# Patient Record
Sex: Female | Born: 1990 | Race: White | Hispanic: No | Marital: Married | Smoking: Never smoker
Health system: Southern US, Community
[De-identification: ages and names within clinical notes are randomized; demographics above are authoritative.]

## PROBLEM LIST (undated history)

## (undated) DIAGNOSIS — J189 Pneumonia, unspecified organism: Secondary | ICD-10-CM

## (undated) DIAGNOSIS — N390 Urinary tract infection, site not specified: Secondary | ICD-10-CM

## (undated) DIAGNOSIS — S42009A Fracture of unspecified part of unspecified clavicle, initial encounter for closed fracture: Secondary | ICD-10-CM

## (undated) DIAGNOSIS — G8929 Other chronic pain: Secondary | ICD-10-CM

## (undated) DIAGNOSIS — R0609 Other forms of dyspnea: Secondary | ICD-10-CM

## (undated) DIAGNOSIS — N809 Endometriosis, unspecified: Secondary | ICD-10-CM

## (undated) DIAGNOSIS — N946 Dysmenorrhea, unspecified: Secondary | ICD-10-CM

## (undated) DIAGNOSIS — E280 Estrogen excess: Secondary | ICD-10-CM

## (undated) DIAGNOSIS — R112 Nausea with vomiting, unspecified: Secondary | ICD-10-CM

## (undated) DIAGNOSIS — R42 Dizziness and giddiness: Secondary | ICD-10-CM

## (undated) DIAGNOSIS — F419 Anxiety disorder, unspecified: Secondary | ICD-10-CM

## (undated) DIAGNOSIS — R55 Syncope and collapse: Secondary | ICD-10-CM

## (undated) DIAGNOSIS — R0600 Dyspnea, unspecified: Secondary | ICD-10-CM

## (undated) DIAGNOSIS — R079 Chest pain, unspecified: Secondary | ICD-10-CM

## (undated) DIAGNOSIS — K5900 Constipation, unspecified: Secondary | ICD-10-CM

## (undated) DIAGNOSIS — R519 Headache, unspecified: Secondary | ICD-10-CM

## (undated) DIAGNOSIS — R002 Palpitations: Secondary | ICD-10-CM

## (undated) DIAGNOSIS — Z9889 Other specified postprocedural states: Secondary | ICD-10-CM

## (undated) HISTORY — DX: Estrogen excess: E28.0

## (undated) HISTORY — DX: Endometriosis, unspecified: N80.9

## (undated) HISTORY — DX: Other chronic pain: G89.29

## (undated) HISTORY — DX: Dyspnea, unspecified: R06.00

## (undated) HISTORY — DX: Other specified postprocedural states: R11.2

## (undated) HISTORY — DX: Dysmenorrhea, unspecified: N94.6

## (undated) HISTORY — DX: Fracture of unspecified part of unspecified clavicle, initial encounter for closed fracture: S42.009A

## (undated) HISTORY — DX: Dizziness and giddiness: R42

## (undated) HISTORY — DX: Urinary tract infection, site not specified: N39.0

## (undated) HISTORY — DX: Headache, unspecified: R51.9

## (undated) HISTORY — PX: ENDOMETRIAL ABLATION: SHX621

## (undated) HISTORY — DX: Palpitations: R00.2

## (undated) HISTORY — DX: Chest pain, unspecified: R07.9

## (undated) HISTORY — DX: Other forms of dyspnea: R06.09

## (undated) HISTORY — DX: Pneumonia, unspecified organism: J18.9

## (undated) HISTORY — DX: Other specified postprocedural states: Z98.890

## (undated) HISTORY — DX: Constipation, unspecified: K59.00

## (undated) HISTORY — DX: Syncope and collapse: R55

## (undated) HISTORY — DX: Anxiety disorder, unspecified: F41.9

---

## 2012-10-25 DIAGNOSIS — N946 Dysmenorrhea, unspecified: Secondary | ICD-10-CM | POA: Insufficient documentation

## 2013-02-01 DIAGNOSIS — N809 Endometriosis, unspecified: Secondary | ICD-10-CM | POA: Insufficient documentation

## 2013-03-15 DIAGNOSIS — K5909 Other constipation: Secondary | ICD-10-CM | POA: Insufficient documentation

## 2016-02-09 DIAGNOSIS — R42 Dizziness and giddiness: Secondary | ICD-10-CM | POA: Insufficient documentation

## 2017-11-15 DIAGNOSIS — F419 Anxiety disorder, unspecified: Secondary | ICD-10-CM | POA: Insufficient documentation

## 2020-02-25 DIAGNOSIS — J343 Hypertrophy of nasal turbinates: Secondary | ICD-10-CM | POA: Insufficient documentation

## 2020-02-25 DIAGNOSIS — M26609 Unspecified temporomandibular joint disorder, unspecified side: Secondary | ICD-10-CM | POA: Insufficient documentation

## 2020-05-20 NOTE — Progress Notes (Deleted)
Cardiology Office Note:    Date:  05/20/2020   ID:  [name], DOB 20-Sep-1990, MRN [id number]  PCP:  No primary care provider on file.  [facility] Cardiologist:  No primary care provider on file.  [facility] Electrophysiologist:  None   Referring MD: Lezlie Lye, PA*    History of Present Illness:    Meagan Richards is a 30 y.o. female with no significant past medical history who was referred by Lazaro Arms, PA for further evaluation of chest pain.   Patient was seen by Cardiology in [facility] in the past for chest pain. Was recommended for exercise echo which was normal. Symptoms were thought to be due to anxiety and possible PTSD. She was recommended for continue lifestyle modification.   No past medical history on file.  *** The histories are not reviewed yet. Please review them in the "History" navigator section and refresh this SmartLink.  Current Medications: No outpatient medications have been marked as taking for the 05/23/20 encounter (Appointment) with Meriam Sprague, MD.     Allergies:   Patient has no allergy information on record.   Social History   Socioeconomic History  . Marital status: Not on file    Spouse name: Not on file  . Number of children: Not on file  . Years of education: Not on file  . Highest education level: Not on file  Occupational History  . Not on file  Tobacco Use  . Smoking status: Not on file  . Smokeless tobacco: Not on file  Substance and Sexual Activity  . Alcohol use: Not on file  . Drug use: Not on file  . Sexual activity: Not on file  Other Topics Concern  . Not on file  Social History Narrative  . Not on file   Social Determinants of Health   Financial Resource Strain: Not on file  Food Insecurity: Not on file  Transportation Needs: Not on file  Physical Activity: Not on file  Stress: Not on file  Social Connections: Not on file     Family History: The patient's ***family history is  not on file.  ROS:   Please see the history of present illness.    *** All other systems reviewed and are negative.  EKGs/Labs/Other Studies Reviewed:    The following studies were reviewed today: Exercise TTE 2019 (OSH): Normal with vigorous LV function  EKG:  EKG is *** ordered today.  The ekg ordered today demonstrates ***  Recent Labs: No results found for requested labs within last 8760 hours.  Recent Lipid Panel No results found for: CHOL, TRIG, HDL, CHOLHDL, VLDL, LDLCALC, LDLDIRECT   Risk Assessment/Calculations:   {Does this patient have ATRIAL FIBRILLATION?:[id number]}   Physical Exam:    VS:  There were no vitals taken for this visit.    Wt Readings from Last 3 Encounters:  No data found for Wt     GEN: *** Well nourished, well developed in no acute distress HEENT: Normal NECK: No JVD; No carotid bruits LYMPHATICS: No lymphadenopathy CARDIAC: ***RRR, no murmurs, rubs, gallops RESPIRATORY:  Clear to auscultation without rales, wheezing or rhonchi  ABDOMEN: Soft, non-tender, non-distended MUSCULOSKELETAL:  No edema; No deformity  SKIN: Warm and dry NEUROLOGIC:  Alert and oriented x 3 PSYCHIATRIC:  Normal affect   ASSESSMENT:    No diagnosis found. PLAN:    In order of problems listed above:  #Chest Pain: #Family history of CAD:   {Are you ordering a CV Procedure (  e.g. stress test, cath, DCCV, TEE, etc)?   Press F2        :[id number]}    Medication Adjustments/Labs and Tests Ordered: Current medicines are reviewed at length with the patient today.  Concerns regarding medicines are outlined above.  No orders of the defined types were placed in this encounter.  No orders of the defined types were placed in this encounter.   There are no Patient Instructions on file for this visit.   Signed, Meriam Sprague, MD  05/20/2020 11:00 AM    [facility]

## 2020-05-23 ENCOUNTER — Ambulatory Visit: Payer: Self-pay | Admitting: Cardiology

## 2020-06-07 NOTE — Progress Notes (Signed)
Cardiology Office Note:    Date:  06/09/2020   ID:  [name], DOB 1990-07-14, MRN [id number]  PCP:  No primary care provider on file.   [facility]  Cardiologist:  No primary care provider on file.  Advanced Practice Provider:  No care team member to display Electrophysiologist:  None    Referring MD: Lezlie Lye, PA*   History of Present Illness:    Meagan Richards is a 30 y.o. female with a hx of anxiety and endometriosis who was referred by Leverne Humbles for further evaluation of chest pain.   The patient states that she has been having intermittent chest pain with occasional jaw, arm, back pain over the past several years. Has been evaluated in the ER before where ECG was normal and labs were normal. Episodes of chest pain are sporadic and she cannot predict when they are going occur. Not exertional related. Episodes can last hours. Occasional swelling in ankles and feet. No orthopnea, PND. Has lightheadedness, dizziness and episodes of syncope especially while on menstrual cycle. Has struggled with multiple health issues over the past several several years and has seen multiple providers which has been frustrating to her. Her father recently passed from a massive MI at age 96 and she states "no one listened to his symptoms and told him he was normal before he passed." She is very concerned that she is having symptoms similar to him.   Family history: Father with MI at age 24 (now deceased), mother with MVP, Maternal GM: MVP, Afib and other leaky valves; Maternal GF with CAD; paternal GF Mi at age 75, maternal GM: MI x2 starting 9s.   Past Medical History:  Diagnosis Date  . Anxiety   . Chest pain   . Chronic headaches   . Clavicle fracture   . Constipation   . Dizziness and giddiness   . DOE (dyspnea on exertion)   . Dysmenorrhea   . Dyspnea   . Endometriosis   . Hyperestrogenism   . Palpitations   . Pneumonia   . PONV  (postoperative nausea and vomiting)   . Syncope and collapse   . UTI (urinary tract infection)   . Vertigo     Past Surgical History:  Procedure Laterality Date  . ENDOMETRIAL ABLATION      Current Medications: Current Meds  Medication Sig  . CLORAZEPATE DIPOTASSIUM PO Take 1 tablet by mouth as needed (for anxiety).  . metoprolol tartrate (LOPRESSOR) 100 MG tablet Take 1 tablet by mouth 2 hours prior to your cardiac ct  . spironolactone (ALDACTONE) 50 MG tablet Take 50 mg by mouth daily.     Allergies:   Latex   Social History   Socioeconomic History  . Marital status: Married    Spouse name: Not on file  . Number of children: Not on file  . Years of education: Not on file  . Highest education level: Not on file  Occupational History  . Not on file  Tobacco Use  . Smoking status: Never Smoker  . Smokeless tobacco: Never Used  Substance and Sexual Activity  . Alcohol use: Never  . Drug use: Never  . Sexual activity: Not on file  Other Topics Concern  . Not on file  Social History Narrative  . Not on file   Social Determinants of Health   Financial Resource Strain: Not on file  Food Insecurity: Not on file  Transportation Needs: Not on file  Physical Activity: Not  on file  Stress: Not on file  Social Connections: Not on file     Family History: Father with MI at age 29 (now deceased), mother with MVP, Maternal GM: MVP, Afib and other leaky valves; Maternal GF with CAD; paternal GF Mi at age 45, maternal GM: MI x2 starting 45s.   ROS:   Please see the history of present illness.    Review of Systems  Constitutional: Positive for malaise/fatigue. Negative for chills and fever.  HENT: Negative for hearing loss.   Eyes: Positive for double vision. Negative for redness.  Respiratory: Positive for shortness of breath.   Cardiovascular: Positive for chest pain, palpitations and leg swelling. Negative for orthopnea, claudication and PND.  Gastrointestinal:  Positive for nausea. Negative for blood in stool and melena.  Genitourinary: Negative for dysuria and flank pain.  Musculoskeletal: Positive for joint pain and myalgias.  Neurological: Positive for dizziness. Negative for loss of consciousness.  Endo/Heme/Allergies: Negative for polydipsia.  Psychiatric/Behavioral: The patient is nervous/anxious.     EKGs/Labs/Other Studies Reviewed:    The following studies were reviewed today: No prior cardiac studies  EKG:  EKG is ordered today.  The ekg ordered today demonstrates NSR with partial RBBB, HR 74  Recent Labs: No results found for requested labs within last 8760 hours.  Recent Lipid Panel No results found for: CHOL, TRIG, HDL, CHOLHDL, VLDL, LDLCALC, LDLDIRECT   Physical Exam:    VS:  BP 96/60   Pulse 74   Ht 5\' 3"  (1.6 m)   Wt 104 lb 3.2 oz (47.3 kg)   SpO2 98%   BMI 18.46 kg/m     Wt Readings from Last 3 Encounters:  06/09/20 104 lb 3.2 oz (47.3 kg)     GEN:  Well nourished, well developed in no acute distress HEENT: Normal NECK: No JVD; No carotid bruits CARDIAC: RRR, no murmurs, rubs, gallops RESPIRATORY:  Clear to auscultation without rales, wheezing or rhonchi  ABDOMEN: Soft, non-tender, non-distended MUSCULOSKELETAL:  No edema; No deformity  SKIN: Warm and dry NEUROLOGIC:  Alert and oriented x 3 PSYCHIATRIC:  Normal affect   ASSESSMENT:    1. Precordial pain   2. Other chest pain    PLAN:    In order of problems listed above:  #Chest Pain: Patient with several year history of chest pain that is episodic in nature. Symptoms are not exertional but have progressed over that time. Has associated jaw, neck, back pain as well as SOB. Has strong family history of premature CAD where her father just passed with a massive MI at age 64. She is very concerned that her symptoms are similar to his and wants to ensure she has a thorough cardiac work-up. -If able, will check coronary CTA -If not, will proceed with  coronary calcium score and exercise treadmill for further work-up    Medication Adjustments/Labs and Tests Ordered: Current medicines are reviewed at length with the patient today.  Concerns regarding medicines are outlined above.  Orders Placed This Encounter  Procedures  . CT CORONARY MORPH W/CTA COR W/SCORE W/CA W/CM &/OR WO/CM  . CT CORONARY FRACTIONAL FLOW RESERVE DATA PREP  . CT CORONARY FRACTIONAL FLOW RESERVE FLUID ANALYSIS  . Basic metabolic panel  . EKG 12-Lead   Meds ordered this encounter  Medications  . metoprolol tartrate (LOPRESSOR) 100 MG tablet    Sig: Take 1 tablet by mouth 2 hours prior to your cardiac ct    Dispense:  1 tablet  Refill:  0    Patient Instructions  Medication Instructions:  Your physician recommends that you continue on your current medications as directed. Please refer to the Current Medication list given to you today.  *If you need a refill on your cardiac medications before your next appointment, please call your pharmacy*   Lab Work: You will need a BMET the week before your cardiac ct.  We will call to let you know when to get that done.   If you have labs (blood work) drawn today and your tests are completely normal, you will receive your results only by: Marland Kitchen MyChart Message (if you have MyChart) OR . A paper copy in the mail If you have any lab test that is abnormal or we need to change your treatment, we will call you to review the results.   Testing/Procedures: Your physician has requested that you have cardiac CT. Cardiac computed tomography (CT) is a painless test that uses an x-ray machine to take clear, detailed pictures of your heart. For further information please visit [email or web address] Please follow instruction sheet BELOW:  Your cardiac CT will be scheduled at one of the below locations:   [facility] [address] [place], [place] [address] [phone]   If scheduled at [facility],  please arrive at the [facility] main entrance (entrance A) of [facility] 30 minutes prior to test start time. Proceed to the [facility] Radiology Department (first floor) to check-in and test prep.   Please follow these instructions carefully (unless otherwise directed):   On the Night Before the Test: . Be sure to Drink plenty of water. . Do not consume any caffeinated/decaffeinated beverages or chocolate 12 hours prior to your test. . Do not take any antihistamines 12 hours prior to your test.  On the Day of the Test: . Drink plenty of water until 1 hour prior to the test. . Do not eat any food 4 hours prior to the test. . You may take your regular medications prior to the test.  . Take metoprolol (Lopressor) 100 mg two hours prior to test. (this has been sent to your pharmacy) . HOLD SPIRONOLACTONE THE NIGHT BEFORE YOUR TEST  . FEMALES- please wear underwire-free bra if available       After the Test: . Drink plenty of water. . After receiving IV contrast, you may experience a mild flushed feeling. This is normal. . On occasion, you may experience a mild rash up to 24 hours after the test. This is not dangerous. If this occurs, you can take Benadryl 25 mg and increase your fluid intake. . If you experience trouble breathing, this can be serious. If it is severe call 911 IMMEDIATELY. If it is mild, please call our office. . If you take any of these medications: Glipizide/Metformin, Avandament, Glucavance, please do not take 48 hours after completing test unless otherwise instructed.   Once we have confirmed authorization from your insurance company, we will call you to set up a date and time for your test. Based on how quickly your insurance processes prior authorizations requests, please allow up to 4 weeks to be contacted for scheduling your Cardiac CT appointment. Be advised that routine Cardiac CT appointments could be scheduled as many as 8 weeks after your provider has  ordered it.  For non-scheduling related questions, please contact the cardiac imaging nurse navigator should you have any questions/concerns: Rockwell Alexandria, Cardiac Imaging Nurse Navigator Larey Brick, Cardiac Imaging Nurse  Navigator [facility] Direct Office Dial: [phone]   For scheduling needs, including cancellations and rescheduling, please call [place], [phone].   Follow-Up: At [facility], you and your health needs are our priority.  As part of our continuing mission to provide you with exceptional heart care, we have created designated Provider Care Teams.  These Care Teams include your primary Cardiologist (physician) and Advanced Practice Providers (APPs -  Physician Assistants and Nurse Practitioners) who all work together to provide you with the care you need, when you need it.  We recommend signing up for the patient portal called "MyChart".  Sign up information is provided on this After Visit Summary.  MyChart is used to connect with patients for Virtual Visits (Telemedicine).  Patients are able to view lab/test results, encounter notes, upcoming appointments, etc.  Non-urgent messages can be sent to your provider as well.   To learn more about what you can do with MyChart, go to [email or web address].    Your next appointment:   Based upon your test   The format for your next appointment:   In Person  Provider:   Laurance FlattenHeather Mireille Lacombe, MD   Other Instructions      Signed, Meriam SpragueHeather E Chirstopher Iovino, MD  06/09/2020 4:11 PM    [facility]

## 2020-06-09 ENCOUNTER — Other Ambulatory Visit: Payer: Self-pay

## 2020-06-09 ENCOUNTER — Ambulatory Visit: Payer: BC Managed Care – PPO | Admitting: Cardiology

## 2020-06-09 ENCOUNTER — Other Ambulatory Visit: Payer: Self-pay | Admitting: *Deleted

## 2020-06-09 ENCOUNTER — Encounter: Payer: Self-pay | Admitting: Cardiology

## 2020-06-09 VITALS — BP 96/60 | HR 74 | Ht 63.0 in | Wt 104.2 lb

## 2020-06-09 DIAGNOSIS — R0789 Other chest pain: Secondary | ICD-10-CM

## 2020-06-09 DIAGNOSIS — R072 Precordial pain: Secondary | ICD-10-CM | POA: Diagnosis not present

## 2020-06-09 DIAGNOSIS — R079 Chest pain, unspecified: Secondary | ICD-10-CM

## 2020-06-09 MED ORDER — METOPROLOL TARTRATE 100 MG PO TABS: ORAL_TABLET | ORAL | 0 refills | Status: DC

## 2020-06-09 NOTE — Patient Instructions (Addendum)
Medication Instructions:  Your physician recommends that you continue on your current medications as directed. Please refer to the Current Medication list given to you today.  *If you need a refill on your cardiac medications before your next appointment, please call your pharmacy*   Lab Work: You will need a BMET the week before your cardiac ct.  We will call to let you know when to get that done.   If you have labs (blood work) drawn today and your tests are completely normal, you will receive your results only by: Marland Kitchen MyChart Message (if you have MyChart) OR . A paper copy in the mail If you have any lab test that is abnormal or we need to change your treatment, we will call you to review the results.   Testing/Procedures: Your physician has requested that you have cardiac CT. Cardiac computed tomography (CT) is a painless test that uses an x-ray machine to take clear, detailed pictures of your heart. For further information please visit [email or web address] Please follow instruction sheet BELOW:  Your cardiac CT will be scheduled at one of the below locations:   [facility] [address] [place], [place] [address] [phone]   If scheduled at [facility], please arrive at the [facility] main entrance (entrance A) of [facility] 30 minutes prior to test start time. Proceed to the [facility] Radiology Department (first floor) to check-in and test prep.   Please follow these instructions carefully (unless otherwise directed):   On the Night Before the Test: . Be sure to Drink plenty of water. . Do not consume any caffeinated/decaffeinated beverages or chocolate 12 hours prior to your test. . Do not take any antihistamines 12 hours prior to your test.  On the Day of the Test: . Drink plenty of water until 1 hour prior to the test. . Do not eat any food 4 hours prior to the test. . You may take your regular medications prior to the test.   . Take metoprolol (Lopressor) 100 mg two hours prior to test. (this has been sent to your pharmacy) . HOLD SPIRONOLACTONE THE NIGHT BEFORE YOUR TEST  . FEMALES- please wear underwire-free bra if available       After the Test: . Drink plenty of water. . After receiving IV contrast, you may experience a mild flushed feeling. This is normal. . On occasion, you may experience a mild rash up to 24 hours after the test. This is not dangerous. If this occurs, you can take Benadryl 25 mg and increase your fluid intake. . If you experience trouble breathing, this can be serious. If it is severe call 911 IMMEDIATELY. If it is mild, please call our office. . If you take any of these medications: Glipizide/Metformin, Avandament, Glucavance, please do not take 48 hours after completing test unless otherwise instructed.   Once we have confirmed authorization from your insurance company, we will call you to set up a date and time for your test. Based on how quickly your insurance processes prior authorizations requests, please allow up to 4 weeks to be contacted for scheduling your Cardiac CT appointment. Be advised that routine Cardiac CT appointments could be scheduled as many as 8 weeks after your provider has ordered it.  For non-scheduling related questions, please contact the cardiac imaging nurse navigator should you have any questions/concerns: Rockwell Alexandria, Cardiac Imaging Nurse Navigator Larey Brick, Cardiac Imaging Nurse Navigator [facility] Direct Office Dial:  [phone]   For scheduling needs, including cancellations and rescheduling, please call [place], [phone].   Follow-Up: At [facility], you and your health needs are our priority.  As part of our continuing mission to provide you with exceptional heart care, we have created designated Provider Care Teams.  These Care Teams include your primary Cardiologist (physician) and Advanced Practice  Providers (APPs -  Physician Assistants and Nurse Practitioners) who all work together to provide you with the care you need, when you need it.  We recommend signing up for the patient portal called "MyChart".  Sign up information is provided on this After Visit Summary.  MyChart is used to connect with patients for Virtual Visits (Telemedicine).  Patients are able to view lab/test results, encounter notes, upcoming appointments, etc.  Non-urgent messages can be sent to your provider as well.   To learn more about what you can do with MyChart, go to [email or web address].    Your next appointment:   Based upon your test   The format for your next appointment:   In Person  Provider:   Laurance Flatten, MD   Other Instructions

## 2020-06-26 ENCOUNTER — Telehealth: Payer: Self-pay | Admitting: Cardiology

## 2020-06-26 NOTE — Telephone Encounter (Signed)
Patients is calling with question concerning her upcoming CT. Please advise

## 2020-06-26 NOTE — Telephone Encounter (Signed)
Reaching out to patient to offer assistance regarding upcoming cardiac imaging study; pt verbalizes understanding of appt date/time, parking situation and where to check in, pre-test NPO status and medications ordered, and verified current allergies; name and call back number provided for further questions should they arise Rockwell Alexandria RN Navigator Cardiac Imaging [facility] [phone] office [phone] cell   Pts recent office visit showed a low BP and has not taken spironolactone since. I presume her BP has improved since then. I have asked the patient to check the BP prior to taking prescribed metoprolol and NOT to take med if SBP < 100  All patients questions were answered. Huntley Dec

## 2020-06-27 ENCOUNTER — Encounter: Payer: Self-pay | Admitting: Cardiology

## 2020-06-27 ENCOUNTER — Telehealth: Payer: Self-pay | Admitting: Cardiology

## 2020-06-27 NOTE — Telephone Encounter (Signed)
Left message for patient to call back  

## 2020-06-27 NOTE — Telephone Encounter (Signed)
Pt c/o BP issue: STAT if pt c/o blurred vision, one-sided weakness or slurred speech  1. What are your last 5 BP readings? Last night 98/70   2. Are you having any other symptoms (ex. Dizziness, headache, blurred vision, passed out)? Last night patient dizzy headache  3. What is your BP issue? Patient has a CT on Monday and need to speak with nurse ASAP

## 2020-06-27 NOTE — Telephone Encounter (Signed)
Meagan Richards 1 hour ago (3:05 PM)   PP    Patient says that she has been going through a lot of health problems and would like to talk with Dr. Shari Prows or nurse regarding them. Please call to discuss       Left message to call back.

## 2020-06-27 NOTE — Telephone Encounter (Signed)
Patient says that she has been going through a lot of health problems and would like to talk with Dr. Shari Prows or nurse regarding them. Please call to discuss

## 2020-06-27 NOTE — Telephone Encounter (Signed)
This encounter was created in error - please disregard.

## 2020-06-30 ENCOUNTER — Ambulatory Visit (HOSPITAL_COMMUNITY): Payer: BC Managed Care – PPO

## 2020-06-30 ENCOUNTER — Telehealth: Payer: Self-pay | Admitting: Cardiology

## 2020-06-30 NOTE — Telephone Encounter (Signed)
Patient called and stated that she cancelled her Cardiac CT today, d/t not hearing back from the nurse, even when several messages were left on the voicemail of the phone number listed. Pt stated her concern was her blood pressure has been low, and was told she wouldn't be able to have CT if blood pressure was low, and had also gone to here gastroenterologist and was told she had a bleeding disorder, and was concerned about doing the CT with this issue as well.   RN stated that she should keep her appointment with Dr. Shari Prows on 07/08/2020 at 8 am, and could discuss her concerns about her CT with her and any other concerns at this time. Pt stated she agreed with this plan and would come to the appointment as scheduled.  Darl Pikes RN

## 2020-06-30 NOTE — Telephone Encounter (Signed)
Pt cancelled cardiac CT.  Has upcoming appt scheduled with Dr. Shari Prows to discuss.  No action needed at this time.

## 2020-06-30 NOTE — Telephone Encounter (Signed)
The patient called and says that she really needs to talk with Dr. Shari Prows or nurse. Says she called last week and did not get a call back and she is really frustrated right now

## 2020-07-01 ENCOUNTER — Telehealth: Payer: Self-pay | Admitting: Physician Assistant

## 2020-07-01 NOTE — Telephone Encounter (Signed)
Patient is a 30 year old female who was recently seen for atypical chest pain and had a coronary CT ordered.  Since then, she has been seen by Dr. Loralyn Freshwater who ordered some test I did told her that she has strep in her body and that she need to evaluate for either scarlet fever or rheumatic fever.  She wanted to obtain additional work-up, I spoke with DOD, since Dr. Livingston Diones started the initial evaluation and requested additional work-up, it would be better if he obtained it in his own office.  She says Dr. Livingston Diones is currently out of town and his PA is undergoing surgery himself therefore they cannot order the lab work.  She also says she has talked to at least 3 providers including her primary care provider, a hospitalist(?), and a dermatologist, all of whom says she should contact cardiology service.  Coronary CT is canceled as she want to work-up her infectious source first as she think her chest pain is coming from the recent strep infection.  I informed her, scarlet fever is usually not directly related to cardiac issues.  Her only symptom is rash on her face at this point.  She is adamant that she want to speak with Dr. Shari Prows or get into see Dr. Shari Prows within the week.  Dr. Devin Going first openings in May.  She does not wish to wait that long.  Again, I informed the patient that her symptom and additional work-up would be better served to be done by a primary care provider or her allergist Dr. Sherre Poot office.  She insisted cardiology service to do the additional work-up and says we are the only one who can.

## 2020-07-02 ENCOUNTER — Other Ambulatory Visit: Payer: Self-pay | Admitting: Pharmacist

## 2020-07-02 ENCOUNTER — Telehealth: Payer: Self-pay | Admitting: *Deleted

## 2020-07-02 MED ORDER — IVABRADINE HCL 5 MG PO TABS: ORAL_TABLET | ORAL | 0 refills | Status: DC

## 2020-07-02 NOTE — Progress Notes (Signed)
7.5mg  of Corlanor given to [facility] in triage for pt.

## 2020-07-02 NOTE — Telephone Encounter (Signed)
-----   Message from Awilda Metro, RPH-CPP sent at 07/02/2020  1:00 PM EDT ----- Regarding: RE: Coronary CTA Corlanor 7.5mg  given to [facility] in triage, sample documented in orders only encounter, thanks! ----- Message ----- From: Meriam Sprague, MD Sent: 07/02/2020  12:28 PM EDT To: Emeline Darling Supple, RPH-CPP, [facility] Triage Subject: Coronary CTA                                   Patient had to cancel her CTA as her blood pressure was in the 90-100s and she was told to not take the metoprolol. Currently her blood pressure remains low in the low 100s. Can we change her to ivabradine for heart rate lowering instead of metoprolol and get her re-scheduled. She is very anxious to have this done. I told her we would call to update her today as well.  Thank you so much!  -Herbert Seta

## 2020-07-02 NOTE — Telephone Encounter (Signed)
Off note:  Will also draw a BMET on this pt while she is in the office on 3/29 to see Dr. Shari Prows, per CTA protocol.  BMET order already previously placed.  Lab appt made for 3/29.

## 2020-07-02 NOTE — Telephone Encounter (Signed)
Called and spoke to the patient. She has recently been told be her Endocrinologist that she has systemic strep. She has been placed on a 6 month course of doxycycline. She was inquiring about a nasal swab and was directed to Cardiology. Given concern for a possible systemic infection and a prolonged ABX course, recommend that she see infectious disease to confirm diagnosis and take over management. We can certainly re-order her CTA and watch for long-term sequela of strep if present, but infectious disease should guide infectious work-up and management. Patient was very grateful for the call.  Laurance Flatten, MD

## 2020-07-02 NOTE — Telephone Encounter (Signed)
Spoke with the pt and informed her that I will leave her samples of Corlanor 7.5 mg to take by mouth for one dose, 2 hours prior to her Cardiac CT that needs to be rescheduled.  Advised her to disregard previous metoprolol one time dose to take prior to her CT, for the metoprolol would make her pressure too soft for preparation of CTA, and Corlanor will not affect this, but will assist with lowering her heart rate for the test.  Informed the pt that the samples will be available for her to pick up at the office at her earliest convenience.  Pt states she lives an hour away, and is already scheduled to come into the office to see Dr. Shari Prows next week on 3/29, and will pick her samples up at that visit.  Advised the pt and reiterated to her that she will need to take the Corlanor 1 1/2 tablets (7.5 mg total) po 2 hours prior to her Coronary CT.  Informed the pt that I will send a message to our CT Nurse Navigator Rockwell [place], and our CT scheduler [place], to call her back and reschedule this test, for a convenient time for her to commute back here.  Pt verbalized understanding and agrees with this plan. Note placed in appt notes that pt will pick up samples at her OV appt with Dr. Shari Prows on 3/29.  Pt appreciative for all the assistance provided.

## 2020-07-06 NOTE — Progress Notes (Signed)
Cardiology Office Note:    Date:  07/08/2020   ID:  Meagan Richards, DOB 26-Jun-1990, MRN [id number]  PCP:  Meagan Richards   [facility]  Cardiologist:  Meriam Sprague, MD  Advanced Practice Provider:  No care team member to display Electrophysiologist:  None    Referring MD: No ref. provider found     History of Present Illness:    Meagan Richards is a 30 y.o. female with a hx of  anxiety and endometriosis who returns to clinic for chest pain.  Last seen in clinic on 05/2820 where she was having intermittent chest pain, jaw and arm pain over the past several years. Had several ER visits where work-up was reassuringly normal. Has also struggled with multiple health issues over the past several several years and has seen multiple providers which has been frustrating to her. Her father r passed from a massive MI at age 49 and she states "no one listened to his symptoms and told him he was normal before he passed." She was very concerned that she is having symptoms similar to him.   At that visit, we recommended coronary CTA which was cancelled due to soft blood pressures. We then recommended using ivabradine instead. She now returns to clinic for follow-up.  Of note, she has also called clinic due to concern for systemic strep infection that was diagnosed by her Immunologist Richards report. She was placed on a 62month course of doxycycline. She had inquired about further testing. We recommended she establish care with Infectious Disease for further work-up and to ensure she merits long-term antibiotic therapy.  Past Medical History:  Diagnosis Date  . Anxiety   . Chest pain   . Chronic headaches   . Clavicle fracture   . Constipation   . Dizziness and giddiness   . DOE (dyspnea on exertion)   . Dysmenorrhea   . Dyspnea   . Endometriosis   . Hyperestrogenism   . Palpitations   . Pneumonia   . PONV (postoperative nausea and vomiting)   . Syncope  and collapse   . UTI (urinary tract infection)   . Vertigo     Past Surgical History:  Procedure Laterality Date  . ENDOMETRIAL ABLATION      Current Medications: Current Meds  Medication Sig  . CLORAZEPATE DIPOTASSIUM PO Take 1 tablet by mouth as needed (for anxiety).     Allergies:   Latex   Social History   Socioeconomic History  . Marital status: Married    Spouse name: Not on file  . Number of children: Not on file  . Years of education: Not on file  . Highest education level: Not on file  Occupational History  . Not on file  Tobacco Use  . Smoking status: Never Smoker  . Smokeless tobacco: Never Used  Substance and Sexual Activity  . Alcohol use: Never  . Drug use: Never  . Sexual activity: Not on file  Other Topics Concern  . Not on file  Social History Narrative  . Not on file   Social Determinants of Health   Financial Resource Strain: Not on file  Food Insecurity: Not on file  Transportation Needs: Not on file  Physical Activity: Not on file  Stress: Not on file  Social Connections: Not on file     Family History: Father with massive MI in his 77s.  ROS:   Please see the history of present illness.    Review  of Systems  Constitutional: Positive for fever and malaise/fatigue. Negative for weight loss.  HENT: Negative for hearing loss.   Eyes: Negative for blurred vision and redness.  Respiratory: Positive for shortness of breath.   Cardiovascular: Positive for chest pain and palpitations. Negative for orthopnea, claudication, leg swelling and PND.  Gastrointestinal: Positive for abdominal pain. Negative for blood in stool and melena.  Genitourinary: Negative for dysuria and flank pain.  Musculoskeletal: Positive for myalgias. Negative for falls.  Skin: Positive for rash.  Neurological: Negative for dizziness and loss of consciousness.  Endo/Heme/Allergies: Negative for polydipsia.  Psychiatric/Behavioral: Negative for substance abuse. The  patient is nervous/anxious.     EKGs/Labs/Other Studies Reviewed:    The following studies were reviewed today: CTA pending   Recent Labs: No results found for requested labs within last 8760 hours.  Recent Lipid Panel No results found for: CHOL, TRIG, HDL, CHOLHDL, VLDL, LDLCALC, LDLDIRECT   Risk Assessment/Calculations:       Physical Exam:    VS:  BP 110/84   Pulse 79   Ht 5\' 3"  (1.6 m)   Wt 101 lb 12.8 oz (46.2 kg)   SpO2 99%   BMI 18.03 kg/m     Wt Readings from Last 3 Encounters:  07/08/20 101 lb 12.8 oz (46.2 kg)  06/09/20 104 lb 3.2 oz (47.3 kg)     GEN:  Well nourished, well developed in no acute distress HEENT: Normal NECK: No JVD; No carotid bruits CARDIAC: RRR, no murmurs, rubs, gallops RESPIRATORY:  Clear to auscultation without rales, wheezing or rhonchi  ABDOMEN: Soft, non-tender, non-distended MUSCULOSKELETAL:  No edema; No deformity  SKIN: Warm and dry NEUROLOGIC:  Alert and oriented x 3 PSYCHIATRIC:  Normal affect   ASSESSMENT:    1. Chest pain, unspecified type   2. Streptococcal infection    PLAN:    In order of problems listed above:  #Chest Pain: Patient with several year history of chest pain that is episodic in nature. Symptoms are not exertional but have progressed over that time. Has associated jaw, neck, back pain as well as SOB. Has strong family history of premature CAD where her father just passed with a massive MI at age 50. She is very concerned that her symptoms are similar to his and wants to ensure she has a thorough cardiac work-up. -Check coronary CTA; plan for ivabradine to slow HR due to soft blood pressure -Check BMET today  #Concern for systemic strep infection: Diagnosed by patient's Immunologist. Was placed on 6 month course doxycycline. Recommended following up with ID for further management. -Follow-up with Infectious Disease       Medication Adjustments/Labs and Tests Ordered: Current medicines are  reviewed at length with the patient today.  Concerns regarding medicines are outlined above.  No orders of the defined types were placed in this encounter.  No orders of the defined types were placed in this encounter.   Patient Instructions  Medication Instructions:  Your physician recommends that you continue on your current medications as directed. Please refer to the Current Medication list given to you today.  *If you need a refill on your cardiac medications before your next appointment, please call your pharmacy*  Lab Work: TODAY: BMET If you have labs (blood work) drawn today and your tests are completely normal, you will receive your results only by: 57 MyChart Message (if you have MyChart) OR . A paper copy in the mail If you have any lab test that is abnormal  or we need to change your treatment, we will call you to review the results.   Follow-Up: At [facility], you and your health needs are our priority.  As part of our continuing mission to provide you with exceptional heart care, we have created designated Provider Care Teams.  These Care Teams include your primary Cardiologist (physician) and Advanced Practice Providers (APPs -  Physician Assistants and Nurse Practitioners) who all work together to provide you with the care you need, when you need it.  Follow up with Dr. Shari Prows as needed based on results of testing.      Signed, Meriam Sprague, MD  07/08/2020 9:17 AM    [facility]

## 2020-07-08 ENCOUNTER — Other Ambulatory Visit: Payer: Self-pay

## 2020-07-08 ENCOUNTER — Encounter: Payer: Self-pay | Admitting: Cardiology

## 2020-07-08 ENCOUNTER — Other Ambulatory Visit: Payer: BC Managed Care – PPO

## 2020-07-08 ENCOUNTER — Ambulatory Visit: Payer: BC Managed Care – PPO | Admitting: Cardiology

## 2020-07-08 VITALS — BP 110/84 | HR 79 | Ht 63.0 in | Wt 101.8 lb

## 2020-07-08 DIAGNOSIS — R079 Chest pain, unspecified: Secondary | ICD-10-CM

## 2020-07-08 DIAGNOSIS — A491 Streptococcal infection, unspecified site: Secondary | ICD-10-CM

## 2020-07-08 LAB — BASIC METABOLIC PANEL
BUN/Creatinine Ratio: 14 (ref 9–23)
BUN: 8 mg/dL (ref 6–20)
CO2: 25 mmol/L (ref 20–29)
Calcium: 9.9 mg/dL (ref 8.7–10.2)
Chloride: 105 mmol/L (ref 96–106)
Creatinine, Ser: 0.58 mg/dL (ref 0.57–1.00)
Glucose: 86 mg/dL (ref 65–99)
Potassium: 4.4 mmol/L (ref 3.5–5.2)
Sodium: 137 mmol/L (ref 134–144)
eGFR: 124 mL/min/{1.73_m2} (ref >59)

## 2020-07-08 NOTE — Telephone Encounter (Signed)
Pt saw Dr. Shari Prows in clinic today, where further plan was discussed.

## 2020-07-08 NOTE — Telephone Encounter (Signed)
Pt saw Dr. Shari Prows in clinic today where further plan was discussed.

## 2020-07-08 NOTE — Patient Instructions (Signed)
Medication Instructions:  Your physician recommends that you continue on your current medications as directed. Please refer to the Current Medication list given to you today.  *If you need a refill on your cardiac medications before your next appointment, please call your pharmacy*  Lab Work: TODAY: BMET If you have labs (blood work) drawn today and your tests are completely normal, you will receive your results only by: Marland Kitchen MyChart Message (if you have MyChart) OR . A paper copy in the mail If you have any lab test that is abnormal or we need to change your treatment, we will call you to review the results.   Follow-Up: At [facility], you and your health needs are our priority.  As part of our continuing mission to provide you with exceptional heart care, we have created designated Provider Care Teams.  These Care Teams include your primary Cardiologist (physician) and Advanced Practice Providers (APPs -  Physician Assistants and Nurse Practitioners) who all work together to provide you with the care you need, when you need it.  Follow up with Dr. Shari Prows as needed based on results of testing.

## 2020-07-10 ENCOUNTER — Telehealth: Payer: Self-pay | Admitting: Cardiology

## 2020-07-10 NOTE — Telephone Encounter (Signed)
RN spoke to [facility] regarding what they needed for the referral. Office notes and labs faxed via epic to Dr. Genice Rouge at [phone]. Notified patient that referral information has been faxed.

## 2020-07-10 NOTE — Telephone Encounter (Signed)
Pt was asked to see a Infection Control Provider, pt needs referral before being seen:  [facility] [place]/Infection [place], [place] [phone]

## 2020-07-11 ENCOUNTER — Telehealth: Payer: Self-pay | Admitting: *Deleted

## 2020-07-11 NOTE — Telephone Encounter (Signed)
-----   Message from Lorrin Jackson sent at 07/11/2020 12:07 PM EDT ----- Regarding: ct heart Scheduled 07/24/20 at 12:15   Thanks, [place]

## 2020-07-15 NOTE — Telephone Encounter (Signed)
[facility] sent a fax back to Dr. Shari Prows today about referral they received on the pt by our office.   Per Provider Dr. Truitt Leep with [facility], she declined referral to them,  stating " This pt is being managed by Immunologist and saw PCP for this as well, and all questions should be advised and directed by the pts Immunologist."  Stamped fax with Dr. Devin Going stamp, so this can be scanned into the pts chart for documentation.   Will also send this message to Dr. Shari Prows as a general FYI, to make her aware of denied referral by Infectious Disease.

## 2020-07-16 NOTE — Telephone Encounter (Signed)
I am worried about the patient being diagnosed with systemic strep and placed on 6 months of antibiotics. I think she would benefit from ID input. Is it possible to refer her to someone else?

## 2020-07-22 ENCOUNTER — Telehealth: Payer: Self-pay | Admitting: Cardiology

## 2020-07-22 NOTE — Telephone Encounter (Signed)
New Message:     Pt says she is scheduled for CT on 07-24-20. She said she was waiting to hear from a nurse to get the instructions.

## 2020-07-22 NOTE — Telephone Encounter (Signed)
Will route this message to our CT Nurse Navigator to follow-up with the pt on Cardiac CT instructions.  CTA is a reschedule from 3/21. Pt saw Dr. Shari Prows in clinic on 3/29 and discussed rescheduling this test at that visit, and pt was given Corlanor samples at that time to take to slow her HR down during the test, due to soft BP.  Will have nurse navigator follow-up with the pt to reinforce instructions.   Samples picked up at last office visit:   Corlanor 7.5 mg to take by mouth for one dose, 2 hours prior to her Cardiac CT     Your cardiac CT will be scheduled at one of the below locations:   [facility] [address] [place], [place] [address] [phone]   If scheduled at [facility], please arrive at the [facility] main entrance (entrance A) of [facility] 30 minutes prior to test start time. Proceed to the [facility] Rad[facility] Department (first floor) to check-in and test prep.   Please follow these instructions carefully (unless otherwise directed):   On the Night Before the Test:  Be sure to Drink plenty of water.  Do not consume any caffeinated/decaffeinated beverages or chocolate 12 hours prior to your test.  Do not take any antihistamines 12 hours prior to your test.  On the Day of the Test:  Drink plenty of water until 1 hour prior to the test.  Do not eat any food 4 hours prior to the test.  You may take your regular medications prior to the test.   Take Corlanor 7.5 mg  by mouth for one dose, 2 hours prior to your Cardiac CT (samples given at last office visit)  FEMALES- please wear underwire-free bra if available       After the Test:  Drink plenty of water.  After receiving IV contrast, you may experience a mild flushed feeling. This is normal.  On occasion, you may experience a mild rash up to 24 hours after the test. This is not dangerous. If this occurs, you can take Benadryl 25 mg and increase your  fluid intake.  If you experience trouble breathing, this can be serious. If it is severe call 911 IMMEDIATELY. If it is mild, please call our office.  If you take any of these medications: Glipizide/Metformin, Avandament, Glucavance, please do not take 48 hours after completing test unless otherwise instructed.   Once we have confirmed authorization from your insurance company, we will call you to set up a date and time for your test. Based on how quickly your insurance processes prior authorizations requests, please allow up to 4 weeks to be contacted for scheduling your Cardiac CT appointment. Be advised that routine Cardiac CT appointments could be scheduled as many as 8 weeks after your provider has ordered it.  For non-scheduling related questions, please contact the cardiac imaging nurse navigator should you have any questions/concerns: Rockwell Alexandria, Cardiac Imaging Nurse Navigator Larey Brick, Cardiac Imaging Nurse Navigator [facility] Direct Office Dial: [phone]   For scheduling needs, including cancellations and rescheduling, please call [place], [phone].

## 2020-07-23 ENCOUNTER — Telehealth (HOSPITAL_COMMUNITY): Payer: Self-pay | Admitting: *Deleted

## 2020-07-23 NOTE — Telephone Encounter (Signed)
Reaching out to patient to offer assistance regarding upcoming cardiac imaging study; pt verbalizes understanding of appt date/time, parking situation and where to check in, pre-test NPO status and medications ordered, and verified current allergies; name and call back number provided for further questions should they arise  Larey Brick RN Navigator Cardiac Imaging [facility] [phone] office [phone] cell  Pt to take 7.5mg  ivabradine 2 hours prior to scan.

## 2020-07-23 NOTE — Telephone Encounter (Signed)
Conversation (Newest Message First)  Dorette Grate, RN     07/23/20 10:00 AM Note Reaching out to patient to offer assistance regarding upcoming cardiac imaging study; pt verbalizes understanding of appt date/time, parking situation and where to check in, pre-test NPO status and medications ordered, and verified current allergies; name and call back number provided for further questions should they arise  Larey Brick RN Navigator Cardiac Imaging [facility] [phone] office [phone] cell  Pt to take 7.5mg  ivabradine 2 hours prior to scan.

## 2020-07-24 ENCOUNTER — Encounter (HOSPITAL_COMMUNITY): Payer: Self-pay

## 2020-07-24 ENCOUNTER — Ambulatory Visit (HOSPITAL_COMMUNITY)
Admission: RE | Admit: 2020-07-24 | Discharge: 2020-07-24 | Disposition: A | Discharge status: Home / Self Care | Payer: BC Managed Care – PPO | Source: Ambulatory Visit | Attending: Cardiology | Admitting: Cardiology

## 2020-07-24 ENCOUNTER — Other Ambulatory Visit: Payer: Self-pay

## 2020-07-24 DIAGNOSIS — R072 Precordial pain: Secondary | ICD-10-CM | POA: Diagnosis present

## 2020-07-24 DIAGNOSIS — Z006 Encounter for examination for normal comparison and control in clinical research program: Secondary | ICD-10-CM

## 2020-07-24 MED ORDER — NITROGLYCERIN 0.4 MG SL SUBL
0.8000 mg | SUBLINGUAL_TABLET | Freq: Once | SUBLINGUAL | Status: AC
  Administered 2020-07-24: 0.8 mg via SUBLINGUAL

## 2020-07-24 MED ORDER — NITROGLYCERIN 0.4 MG SL SUBL
SUBLINGUAL_TABLET | SUBLINGUAL | Status: AC
  Filled 2020-07-24: qty 2

## 2020-07-24 MED ORDER — IOHEXOL 350 MG/ML SOLN
95.0000 mL | Freq: Once | INTRAVENOUS | Status: AC | PRN
  Administered 2020-07-24: 95 mL via INTRAVENOUS

## 2020-07-24 NOTE — Research (Signed)
IDENTIFY Informed Consent                  Subject Name: Meagan Richards   Subject met inclusion and exclusion criteria.  The informed consent form, study requirements and expectations were reviewed with the subject and questions and concerns were addressed prior to the signing of the consent form.  The subject verbalized understanding of the trial requirements.  The subject agreed to participate in the IDENTIFY trial and signed the informed consent at 11:12AM on 07/24/20.  The informed consent was obtained prior to performance of any protocol-specific procedures for the subject.  A copy of the signed informed consent was given to the subject and a copy was placed in the subject's medical record.   Meade Maw , Naval architect

## 2020-07-24 NOTE — Progress Notes (Signed)
CT scan completed. Tolerated well. Pt states her head feels funny. Offered soda for caffeine, pt states she does not drink soda and will drink some tea in the car. Pt states she is ok to go home. D/C home with husband and mother in wheelchair. Awake and alert. In no distress

## 2021-10-07 IMAGING — CT CT HEART MORP W/ CTA COR W/ SCORE W/ CA W/CM &/OR W/O CM
4 of 7 series · 8 of 20 positions shown, 9 images · IV contrast (APPLIED)
Comparison: None.
COMPARISON: None.

Addendum:
EXAM:
OVER-READ INTERPRETATION  CT CHEST

The following report is an over-read performed by radiologist Dr.
Jose Ricardo Rengifo [REDACTED] on 07/24/2020. This
over-read does not include interpretation of cardiac or coronary
anatomy or pathology. The coronary CTA interpretation by the
cardiologist is attached.
CLINICAL DATA: This is a 29 year old female with chest pain/anginal
equivalent.
Cardiac/Coronary  CTA
TECHNIQUE: The patient was scanned on a [employer] Force scanner.

[Series 6: best diast 76 % · axial · 0.32mm/px · z∈[+1060,+1105]mm · 2 of 338 slices shown]
[im 113/338  vessel]
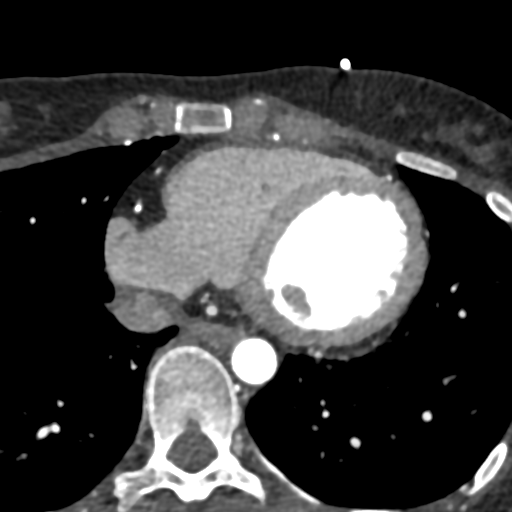
[im 225/338  vessel]
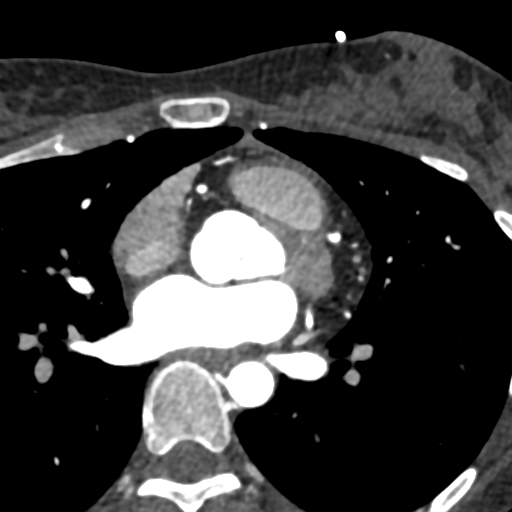

[Series 7: best syst · axial · 0.32mm/px · z∈[+1060,+1105]mm · 2 of 338 slices shown, 3 images]
[im 113/338  vessel]
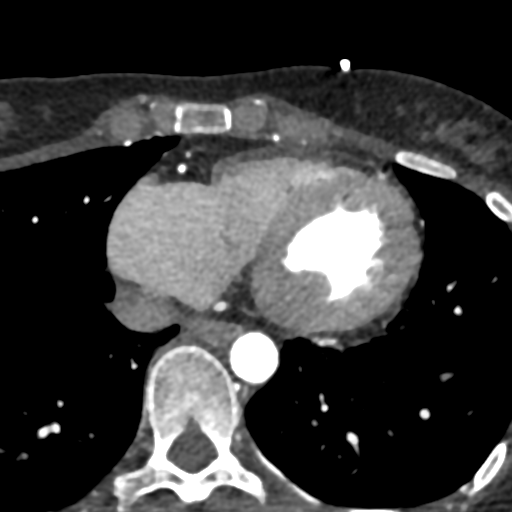
[im 113/338  lung]
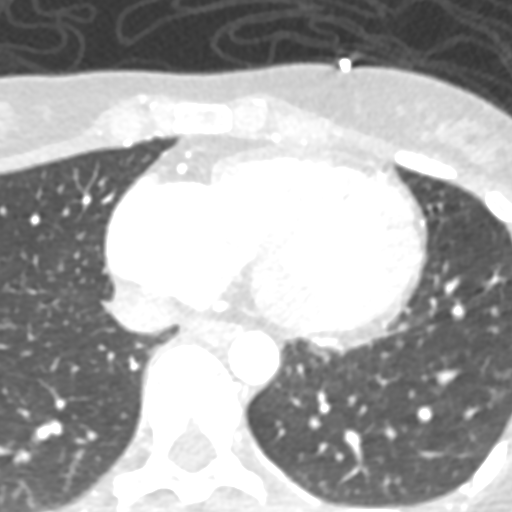
[im 225/338  vessel]
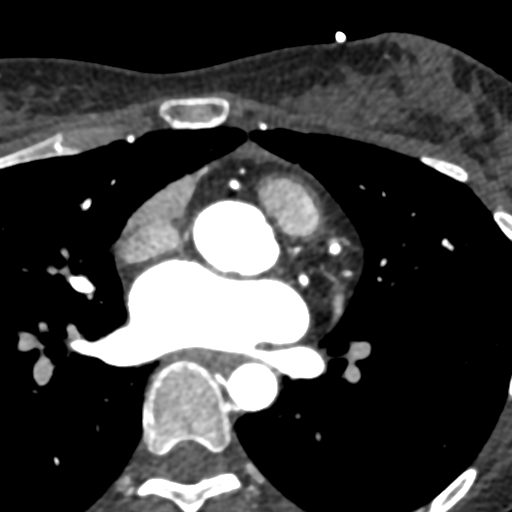

[Series 8: ts diast sharp 76 % · axial · 0.32mm/px · z∈[+1060,+1105]mm · 2 of 338 slices shown]
[im 113/338  lung]
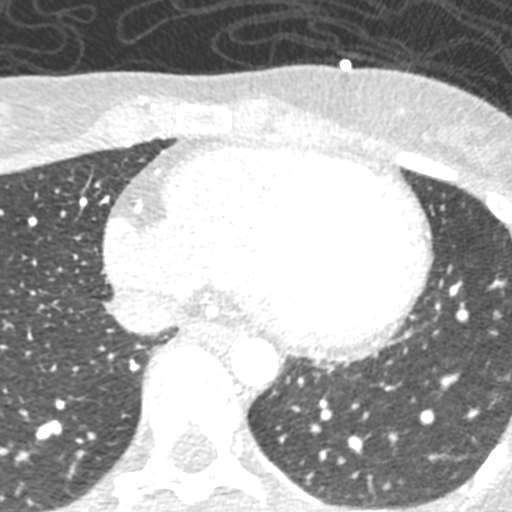
[im 225/338  lung]
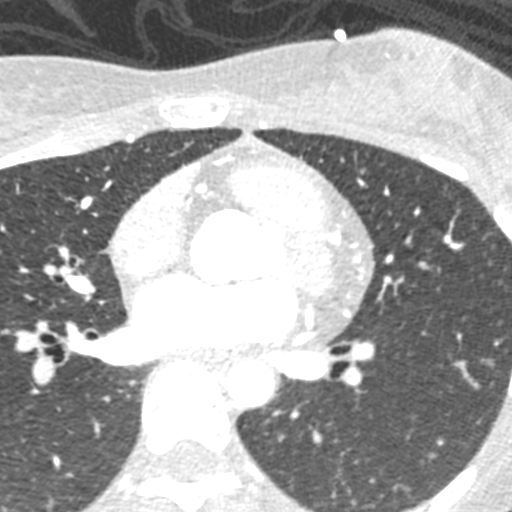

[Series 9: ts syst sharp · axial · 0.32mm/px · z∈[+1060,+1105]mm · 2 of 338 slices shown]
[im 113/338  lung]
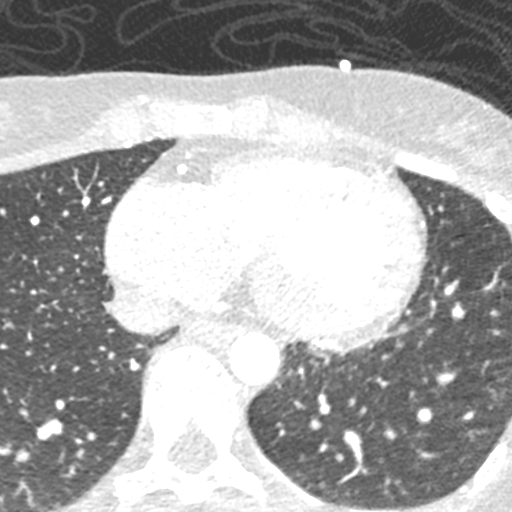
[im 225/338  lung]
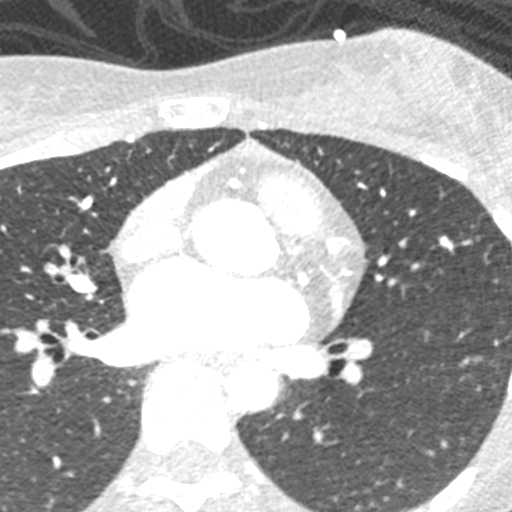

[8 of 20 positions shown; findings below may reference images not displayed]

FINDINGS: Vascular: No significant noncardiac vascular findings.

Mediastinum/Nodes: Visualized mediastinum and hilar regions
demonstrate no lymphadenopathy or masses.

Lungs/Pleura: Visualized lungs show no evidence of pulmonary edema,
consolidation, pneumothorax, nodule or pleural fluid.

Upper Abdomen: No acute abnormality.

Musculoskeletal: No chest wall mass or suspicious bone lesions
identified.
IMPRESSION: No significant incidental findings.
FINDINGS: A 100 kV prospective scan was triggered in the descending thoracic
aorta at 111 HU's. Axial non-contrast 3 mm slices were carried out
through the heart. The data set was analyzed on a dedicated work
station and scored using the Agatson method. Gantry rotation speed
was 250 msecs and collimation was .6 mm. No beta blockade and 0.8 mg
of sl NTG was given. The 3D data set was reconstructed in 5%
intervals of the 67-82 % of the R-R cycle. Diastolic phases were
analyzed on a dedicated work station using MPR, MIP and VRT modes.
The patient received 80 cc of contrast.

Aorta:  Normal size (24 mm).  No calcifications.  No dissection.

Aortic Valve:  Trileaflet.  No calcifications.

Coronary Arteries:  Normal coronary origin.  Right dominance.

RCA is a large dominant artery that gives rise to PDA and PLA. There
is no plaque.

Left main is a large artery that gives rise to LAD, Intermedius
Ramus and LCX arteries.

LAD is a large vessel that has no plaque.

Intermedius Ramus is a small vessel with no plaques.

LCX is a non-dominant artery that gives rise to one large OM1
branch. There is no plaque.

Other findings:

Normal pulmonary vein drainage into the left atrium.

Normal left atrial appendage without a thrombus.

Normal size of the pulmonary artery.
IMPRESSION: 1. Coronary calcium score of 0. This was 0 percentile for age and
sex matched control.

2. Normal coronary origin with right dominance.

3. No evidence of CAD. CAD-RADS 0. No evidence of CAD (0%). Consider
non-atherosclerotic causes of chest pain.

*** End of Addendum ***
EXAM:
OVER-READ INTERPRETATION  CT CHEST

The following report is an over-read performed by radiologist Dr.
Jose Ricardo Rengifo [REDACTED] on 07/24/2020. This
over-read does not include interpretation of cardiac or coronary
anatomy or pathology. The coronary CTA interpretation by the
cardiologist is attached.
FINDINGS: Vascular: No significant noncardiac vascular findings.

Mediastinum/Nodes: Visualized mediastinum and hilar regions
demonstrate no lymphadenopathy or masses.

Lungs/Pleura: Visualized lungs show no evidence of pulmonary edema,
consolidation, pneumothorax, nodule or pleural fluid.

Upper Abdomen: No acute abnormality.

Musculoskeletal: No chest wall mass or suspicious bone lesions
identified.
IMPRESSION: No significant incidental findings.

## 2021-12-16 ENCOUNTER — Telehealth: Payer: Self-pay | Admitting: Cardiology

## 2021-12-16 NOTE — Telephone Encounter (Signed)
Dashley, Monts - 12/16/2021  2:23 PM Meriam Sprague, MD  Sent: Wed December 16, 2021  4:17 PM  To: Loa Socks, LPN          Message  Thank you so much!

## 2021-12-16 NOTE — Telephone Encounter (Signed)
Pt c/o BP issue: STAT if pt c/o blurred vision, one-sided weakness or slurred speech  1. What are your last 5 BP readings? 90/59  2. Are you having any other symptoms (ex. Dizziness, headache, blurred vision, passed out)? Headache, dizziness   3. What is your BP issue? Low  Patient feels like th HR rhythm is off

## 2021-12-16 NOTE — Telephone Encounter (Signed)
Pt is calling in to request a sooner appt with Dr. Shari Prows for sometime this month.  She states she does not want to see an Extender for she only trust Dr. Devin Going suggestions.   Pt states for several months now, she has been battling with low blood pressure and palpitations at nighttime.   She states this mostly happens at night. She reports at around bedtime she has been experiencing complaints of headache, dizziness, low pressures, and palpitations.   She states when she checks her pressures then, her numbers are low with last lowest reading this past weekend at 90/59.  She said HR in the 60s. She reports she has no complaints during the day.  She states she is not taking anything differently.    She reports she is staying hydrated with plenty of water during the day and a sweet tea at dinner time.   Pt reports she has been into see her PCP about this, and they advised her that she should touch base with our office sometime soon, for further evaluation.   Pt states that the scheduler offered her an appt to see Dr. Shari Prows for sometime in Oct, but she is requesting a sooner appt.  She is refusing to see an APP, which I offered to her this week.   Pt denies any chest pain, sob, N/V, diaphoresis, orthopnea, DOE, pre-syncopal or syncopal episodes when her complaints occur.   Scheduled the pt to see Dr. Shari Prows for 9/27 at 2:30 pm.  She is aware to arrive 15 mins prior to this appt.   Advised the pt to continue hydrating appropriately, wear compressions during the day, consider drinking an electrolyte replacement drink like gatorade zero instead of tea. Advised her to eat small frequent meals and she could consider purchasing OTC Mg 200 mg po to take at bedtime.  Advised her to make sure she changes positions slowly to promote good circulation.   Pt aware I will forward this information to Dr. Shari Prows to make her aware of this plan and to advise on any other additional  recommendations.   ED precautions provided to the pt if symptoms were to worsen or persist.   Pt verbalized understanding and agrees with this plan.

## 2022-01-03 NOTE — Progress Notes (Deleted)
Cardiology Office Note:    Date:  01/03/2022   ID:  Meagan Richards, DOB September 15, 1990, MRN [id number]  PCP:  Patient, No Pcp Per   [facility]  Cardiologist:  Freada Bergeron, MD  Advanced Practice Provider:  No care team member to display Electrophysiologist:  None    Referring MD: No ref. provider found     History of Present Illness:    Meagan Richards is a 31 y.o. female with a hx of anxiety and endometriosis who presents to clinic for follow-up of chest pain.  Patient was seen in clinic on 05/2820 where she was having intermittent chest pain, jaw and arm pain over the past several years. Had several ER visits where work-up was reassuringly normal. Has also struggled with multiple health issues over the past several several years and has seen multiple providers which has been frustrating to her. Her father passed from a massive MI at age 9 and she states "no one listened to his symptoms and told him he was normal before he passed." She was very concerned that she is having symptoms similar to him.   At that visit, we recommended coronary CTA which was cancelled due to soft blood pressures. We then recommended using ivabradine instead. She ultimately underwent coronary CTA on 07/2020 which showed normal coronary arteries with Ca score 0.   Of note, she has also called clinic due to concern for systemic strep infection that was diagnosed by her Immunologist per report. She was placed on a 59month course of doxycycline. She had inquired about further testing. We recommended she establish care with Infectious Disease for further work-up and to ensure she merits long-term antibiotic therapy.  She called the office on 12/16/21 for episodes of low blood pressure and palpitations prompting visit today.  Today, ***  Past Medical History:  Diagnosis Date   Anxiety    Chest pain    Chronic headaches    Clavicle fracture    Constipation    Dizziness and  giddiness    DOE (dyspnea on exertion)    Dysmenorrhea    Dyspnea    Endometriosis    Hyperestrogenism    Palpitations    Pneumonia    PONV (postoperative nausea and vomiting)    Syncope and collapse    UTI (urinary tract infection)    Vertigo     Past Surgical History:  Procedure Laterality Date   ENDOMETRIAL ABLATION      Current Medications: No outpatient medications have been marked as taking for the 01/06/22 encounter (Appointment) with Freada Bergeron, MD.     Allergies:   Latex   Social History   Socioeconomic History   Marital status: Married    Spouse name: Not on file   Number of children: Not on file   Years of education: Not on file   Highest education level: Not on file  Occupational History   Not on file  Tobacco Use   Smoking status: Never   Smokeless tobacco: Never  Substance and Sexual Activity   Alcohol use: Never   Drug use: Never   Sexual activity: Not on file  Other Topics Concern   Not on file  Social History Narrative   Not on file   Social Determinants of Health   Financial Resource Strain: Not on file  Food Insecurity: Not on file  Transportation Needs: Not on file  Physical Activity: Not on file  Stress: Not on file  Social Connections: Not on  file     Family History: Father with massive MI in his 33s.  ROS:   Please see the history of present illness.    Review of Systems  Constitutional:  Positive for fever and malaise/fatigue. Negative for weight loss.  HENT:  Negative for hearing loss.   Eyes:  Negative for blurred vision and redness.  Respiratory:  Positive for shortness of breath.   Cardiovascular:  Positive for chest pain and palpitations. Negative for orthopnea, claudication, leg swelling and PND.  Gastrointestinal:  Positive for abdominal pain. Negative for blood in stool and melena.  Genitourinary:  Negative for dysuria and flank pain.  Musculoskeletal:  Positive for myalgias. Negative for falls.  Skin:   Positive for rash.  Neurological:  Negative for dizziness and loss of consciousness.  Endo/Heme/Allergies:  Negative for polydipsia.  Psychiatric/Behavioral:  Negative for substance abuse. The patient is nervous/anxious.     EKGs/Labs/Other Studies Reviewed:    The following studies were reviewed today: CTA 08/22/20:   FINDINGS: A 100 kV prospective scan was triggered in the descending thoracic aorta at 111 HU's. Axial non-contrast 3 mm slices were carried out through the heart. The data set was analyzed on a dedicated work station and scored using the Agatson method. Gantry rotation speed was 250 msecs and collimation was .6 mm. No beta blockade and 0.8 mg of sl NTG was given. The 3D data set was reconstructed in 5% intervals of the 67-82 % of the R-R cycle. Diastolic phases were analyzed on a dedicated work station using MPR, MIP and VRT modes. The patient received 80 cc of contrast.   Aorta:  Normal size (24 mm).  No calcifications.  No dissection.   Aortic Valve:  Trileaflet.  No calcifications.   Coronary Arteries:  Normal coronary origin.  Right dominance.   RCA is a large dominant artery that gives rise to PDA and PLA. There is no plaque.   Left main is a large artery that gives rise to LAD, Intermedius Ramus and LCX arteries.   LAD is a large vessel that has no plaque.   Intermedius Ramus is a small vessel with no plaques.   LCX is a non-dominant artery that gives rise to one large OM1 branch. There is no plaque.   Other findings:   Normal pulmonary vein drainage into the left atrium.   Normal left atrial appendage without a thrombus.   Normal size of the pulmonary artery.   IMPRESSION: 1. Coronary calcium score of 0. This was 0 percentile for age and sex matched control.   2. Normal coronary origin with right dominance.   3. No evidence of CAD. CAD-RADS 0. No evidence of CAD (0%). Consider non-atherosclerotic causes of chest pain.     Recent  Labs: No results found for requested labs within last 365 days.  Recent Lipid Panel No results found for: "CHOL", "TRIG", "HDL", "CHOLHDL", "VLDL", "LDLCALC", "LDLDIRECT"   Risk Assessment/Calculations:       Physical Exam:    VS:  There were no vitals taken for this visit.    Wt Readings from Last 3 Encounters:  07/08/20 101 lb 12.8 oz (46.2 kg)  06/09/20 104 lb 3.2 oz (47.3 kg)     GEN:  Well nourished, well developed in no acute distress HEENT: Normal NECK: No JVD; No carotid bruits CARDIAC: RRR, no murmurs, rubs, gallops RESPIRATORY:  Clear to auscultation without rales, wheezing or rhonchi  ABDOMEN: Soft, non-tender, non-distended MUSCULOSKELETAL:  No edema; No deformity  SKIN:  Warm and dry NEUROLOGIC:  Alert and oriented x 3 PSYCHIATRIC:  Normal affect   ASSESSMENT:    No diagnosis found.  PLAN:    In order of problems listed above:  #Palpitations: Has been having several month history of palpitations that are worse at night. Has remained hydrated and no significant caffeine intake. TSH normal. Hemoglobin normal. -Check zio monitor  #Low Blood Pressure: Patient reports episodes of low blood pressure in the evenings with associated dizziness. No syncope  #Chest Pain: Patient with several year history of chest pain that is episodic in nature. Fortunately coronary CTA 07/2020 with no evidence of coronary disease and Ca score 0.  #Concern for systemic strep infection: Diagnosed by patient's Immunologist. Was placed on 6 month course doxycycline. Recommended following up with ID for further management. -Follow-up with Infectious Disease       Medication Adjustments/Labs and Tests Ordered: Current medicines are reviewed at length with the patient today.  Concerns regarding medicines are outlined above.  No orders of the defined types were placed in this encounter.  No orders of the defined types were placed in this encounter.   There are no Patient  Instructions on file for this visit.    Signed, Meriam Sprague, MD  01/03/2022 1:28 PM    [facility]

## 2022-01-06 ENCOUNTER — Telehealth: Payer: Self-pay | Admitting: Cardiology

## 2022-01-06 ENCOUNTER — Ambulatory Visit: Payer: Self-pay | Admitting: Cardiology

## 2022-01-06 NOTE — Telephone Encounter (Signed)
Scheduling was able to make contact with the Meagan Richards and rescheduled her to see Dr. Johney Frame on next Tuesday 10/3 at 0830.    Meagan Richards agreed to appt date and time.

## 2022-01-06 NOTE — Telephone Encounter (Signed)
Endorsed to our scheduling team that they can call the pt back and schedule her to be seen by Dr. Johney Frame for next Tuesday 10/3 at 0830 appt slot.  Slot held for scheduling dept.   Scheduling will  be reaching out to the pt to offer this date and time.

## 2022-01-06 NOTE — Telephone Encounter (Signed)
Patient had to reschedule Dr. Jacolyn Reedy appt due to Dr. Johney Frame being sick. Patient turned down appt on 9/28 at 8:40 am. Told patient next available was on 10/31. Patient did not want to wait that long. Patient also refused to get appt with APP or get on cancellation list. Patient wanted to talk with nurse asap. Patient turned down every suggestion given.

## 2022-01-07 NOTE — Progress Notes (Deleted)
Cardiology Office Note:    Date:  01/07/2022   ID:  Meagan Richards, DOB [birth year], MRN [id number]  PCP:  Patient, No Pcp Per   [facility]  Cardiologist:  Freada Bergeron, MD  Advanced Practice Provider:  No care team member to display Electrophysiologist:  None    Referring MD: No ref. provider found     History of Present Illness:    Meagan Richards is a 31 y.o. female with a hx of anxiety and endometriosis who presents to clinic for follow-up of chest pain.  Patient was seen in clinic on 05/2820 where she was having intermittent chest pain, jaw and arm pain over the past several years. Had several ER visits where work-up was reassuringly normal. Has also struggled with multiple health issues over the past several several years and has seen multiple providers which has been frustrating to her. Her father passed from a massive MI at age 38 and she states "no one listened to his symptoms and told him he was normal before he passed." She was very concerned that she is having symptoms similar to him.   At that visit, we recommended coronary CTA which was cancelled due to soft blood pressures. We then recommended using ivabradine instead. She ultimately underwent coronary CTA on 07/2020 which showed normal coronary arteries with Ca score 0.   Of note, she has also called clinic due to concern for systemic strep infection that was diagnosed by her Immunologist per report. She was placed on a 62month course of doxycycline. She had inquired about further testing. We recommended she establish care with Infectious Disease for further work-up and to ensure she merits long-term antibiotic therapy.  She called the office on 12/16/21 for episodes of low blood pressure and palpitations prompting visit today.  Today, ***  Past Medical History:  Diagnosis Date   Anxiety    Chest pain    Chronic headaches    Clavicle fracture    Constipation    Dizziness and  giddiness    DOE (dyspnea on exertion)    Dysmenorrhea    Dyspnea    Endometriosis    Hyperestrogenism    Palpitations    Pneumonia    PONV (postoperative nausea and vomiting)    Syncope and collapse    UTI (urinary tract infection)    Vertigo     Past Surgical History:  Procedure Laterality Date   ENDOMETRIAL ABLATION      Current Medications: No outpatient medications have been marked as taking for the 01/12/22 encounter (Appointment) with Freada Bergeron, MD.     Allergies:   Latex   Social History   Socioeconomic History   Marital status: Married    Spouse name: Not on file   Number of children: Not on file   Years of education: Not on file   Highest education level: Not on file  Occupational History   Not on file  Tobacco Use   Smoking status: Never   Smokeless tobacco: Never  Substance and Sexual Activity   Alcohol use: Never   Drug use: Never   Sexual activity: Not on file  Other Topics Concern   Not on file  Social History Narrative   Not on file   Social Determinants of Health   Financial Resource Strain: Not on file  Food Insecurity: Not on file  Transportation Needs: Not on file  Physical Activity: Not on file  Stress: Not on file  Social Connections: Not on  file     Family History: Father with massive MI in his 59s.  ROS:   Please see the history of present illness.    Review of Systems  Constitutional:  Positive for fever and malaise/fatigue. Negative for weight loss.  HENT:  Negative for hearing loss.   Eyes:  Negative for blurred vision and redness.  Respiratory:  Positive for shortness of breath.   Cardiovascular:  Positive for chest pain and palpitations. Negative for orthopnea, claudication, leg swelling and PND.  Gastrointestinal:  Positive for abdominal pain. Negative for blood in stool and melena.  Genitourinary:  Negative for dysuria and flank pain.  Musculoskeletal:  Positive for myalgias. Negative for falls.  Skin:   Positive for rash.  Neurological:  Negative for dizziness and loss of consciousness.  Endo/Heme/Allergies:  Negative for polydipsia.  Psychiatric/Behavioral:  Negative for substance abuse. The patient is nervous/anxious.     EKGs/Labs/Other Studies Reviewed:    The following studies were reviewed today: CTA Aug 21, 2020:   FINDINGS: A 100 kV prospective scan was triggered in the descending thoracic aorta at 111 HU's. Axial non-contrast 3 mm slices were carried out through the heart. The data set was analyzed on a dedicated work station and scored using the Agatson method. Gantry rotation speed was 250 msecs and collimation was .6 mm. No beta blockade and 0.8 mg of sl NTG was given. The 3D data set was reconstructed in 5% intervals of the 67-82 % of the R-R cycle. Diastolic phases were analyzed on a dedicated work station using MPR, MIP and VRT modes. The patient received 80 cc of contrast.   Aorta:  Normal size (24 mm).  No calcifications.  No dissection.   Aortic Valve:  Trileaflet.  No calcifications.   Coronary Arteries:  Normal coronary origin.  Right dominance.   RCA is a large dominant artery that gives rise to PDA and PLA. There is no plaque.   Left main is a large artery that gives rise to LAD, Intermedius Ramus and LCX arteries.   LAD is a large vessel that has no plaque.   Intermedius Ramus is a small vessel with no plaques.   LCX is a non-dominant artery that gives rise to one large OM1 branch. There is no plaque.   Other findings:   Normal pulmonary vein drainage into the left atrium.   Normal left atrial appendage without a thrombus.   Normal size of the pulmonary artery.   IMPRESSION: 1. Coronary calcium score of 0. This was 0 percentile for age and sex matched control.   2. Normal coronary origin with right dominance.   3. No evidence of CAD. CAD-RADS 0. No evidence of CAD (0%). Consider non-atherosclerotic causes of chest pain.     Recent  Labs: No results found for requested labs within last 365 days.  Recent Lipid Panel No results found for: "CHOL", "TRIG", "HDL", "CHOLHDL", "VLDL", "LDLCALC", "LDLDIRECT"   Risk Assessment/Calculations:       Physical Exam:    VS:  There were no vitals taken for this visit.    Wt Readings from Last 3 Encounters:  07/08/20 101 lb 12.8 oz (46.2 kg)  06/09/20 104 lb 3.2 oz (47.3 kg)     GEN:  Well nourished, well developed in no acute distress HEENT: Normal NECK: No JVD; No carotid bruits CARDIAC: RRR, no murmurs, rubs, gallops RESPIRATORY:  Clear to auscultation without rales, wheezing or rhonchi  ABDOMEN: Soft, non-tender, non-distended MUSCULOSKELETAL:  No edema; No deformity  SKIN:  Warm and dry NEUROLOGIC:  Alert and oriented x 3 PSYCHIATRIC:  Normal affect   ASSESSMENT:    No diagnosis found.  PLAN:    In order of problems listed above:  #Palpitations: Has been having several month history of palpitations that are worse at night. Has remained hydrated and no significant caffeine intake. TSH normal. Hemoglobin normal. -Check zio monitor  #Low Blood Pressure: Patient reports episodes of low blood pressure in the evenings with associated dizziness. No syncope  #Chest Pain: Patient with several year history of chest pain that is episodic in nature. Fortunately coronary CTA 07/2020 with no evidence of coronary disease and Ca score 0.  #Concern for systemic strep infection: Diagnosed by patient's Immunologist. Was placed on 6 month course doxycycline. Recommended following up with ID for further management. -Follow-up with Infectious Disease       Medication Adjustments/Labs and Tests Ordered: Current medicines are reviewed at length with the patient today.  Concerns regarding medicines are outlined above.  No orders of the defined types were placed in this encounter.  No orders of the defined types were placed in this encounter.   There are no Patient  Instructions on file for this visit.    Signed, Meriam Sprague, MD  01/07/2022 1:48 PM    [facility]

## 2022-01-12 ENCOUNTER — Encounter: Payer: Self-pay | Admitting: Cardiology

## 2022-01-12 ENCOUNTER — Ambulatory Visit (INDEPENDENT_AMBULATORY_CARE_PROVIDER_SITE_OTHER): Payer: 59

## 2022-01-12 ENCOUNTER — Ambulatory Visit: Payer: 59 | Attending: Cardiology | Admitting: Cardiology

## 2022-01-12 ENCOUNTER — Telehealth: Payer: Self-pay | Admitting: *Deleted

## 2022-01-12 VITALS — BP 112/68 | HR 73 | Ht 63.0 in | Wt 97.6 lb

## 2022-01-12 DIAGNOSIS — A491 Streptococcal infection, unspecified site: Secondary | ICD-10-CM

## 2022-01-12 DIAGNOSIS — R002 Palpitations: Secondary | ICD-10-CM

## 2022-01-12 DIAGNOSIS — R079 Chest pain, unspecified: Secondary | ICD-10-CM | POA: Diagnosis not present

## 2022-01-12 DIAGNOSIS — R42 Dizziness and giddiness: Secondary | ICD-10-CM

## 2022-01-12 NOTE — Telephone Encounter (Signed)
-----   Message from Jennefer Bravo sent at 01/12/2022  9:26 AM EDT ----- Regarding: RE: 7 DAY ZIO PER DR. Trina Ao done ----- Message ----- From: Nuala Alpha, LPN Sent: 81/04/313   9:13 AM EDT To: Nuala Alpha, LPN; Shelly A Wells; # Subject: 7 DAY ZIO PER DR. Trina Ao                     Dr. Johney Frame ordered a 7 day zio on this pt for palpitations.  Please enroll and let me know?  Thanks [employer]

## 2022-01-12 NOTE — Progress Notes (Unsigned)
Enrolled for [employer] to mail a ZIO XT long term holter monitor to the patients address on file.  

## 2022-01-12 NOTE — Progress Notes (Signed)
Cardiology Office Note:    Date:  01/12/2022   ID:  Meagan Richards, DOB 30-Mar-1991, MRN [id number]  PCP:  Osie Cheeks, [place]  Cardiologist:  Freada Bergeron, MD  Advanced Practice Provider:  No care team member to display Electrophysiologist:  None   Referring MD: No ref. provider found    History of Present Illness:    Meagan Richards is a 31 y.o. female with a hx of anxiety and endometriosis who presents to clinic for follow-up of chest pain.  Patient was seen in clinic on 05/2820 where she was having intermittent chest pain, jaw and arm pain over the past several years. Had several ER visits where work-up was reassuringly normal. Has also struggled with multiple health issues over the past several several years and has seen multiple providers which has been frustrating to her. Her father passed from a massive MI at age 97 and she states "no one listened to his symptoms and told him he was normal before he passed." She was very concerned that she is having symptoms similar to him.   At that visit, we recommended coronary CTA which was cancelled due to soft blood pressures. We then recommended using ivabradine instead. She ultimately underwent coronary CTA on 07/2020 which showed normal coronary arteries with Ca score 0.   Of note, she has also called clinic due to concern for systemic strep infection that was diagnosed by her Immunologist per report. She was placed on a 43month course of doxycycline. She had inquired about further testing. We recommended she establish care with Infectious Disease for further work-up and to ensure she merits long-term antibiotic therapy.  She called the office on 12/16/21 for episodes of low blood pressure and palpitations prompting visit today.  Today, she is accompanied by a family member. Her main concern today is episodes of her BP dropping at night. This does not occur every night, but has been  more frequent lately. These episodes "start in my head", described as feeling "off," "funny," and "woozy". During her latest episode, her blood pressure was 90/66 and her heart rate was also very low. At home she would normally have blood pressures in the 120s-130s/70s. She usually feels it the most at night time. She has been woken out of her sleep due to her symptoms and has become close to passing out at one time. She has been trying to drink Gatorade zero or Sunny D to help, but she has not noticed any difference.   She complains of a right-sided, intermittent headache that began about a week ago. This occurs constantly through the night and persists when she wakes up. It then becomes intermittent throughout the daytime.  She usually eats dinner pretty late, around 7:30-8pm pm. She tends to eat foods such as pizza, chicken nuggets, macaroni, and grilled cheese. She hasn't changed her diet or meal times, but notes that she has been trying to eat a snack during the day. Due to her with struggles with constipation and morning GI upset, she doesn't usually eat breakfast. Her first meal is usually in the late morning or early afternoon. Dinner is her heaviest meal.  She denies any peripheral edema. No orthopnea, or PND.    Past Medical History:  Diagnosis Date   Anxiety    Chest pain    Chronic headaches    Clavicle fracture    Constipation    Dizziness and giddiness    DOE (dyspnea on exertion)  Dysmenorrhea    Dyspnea    Endometriosis    Hyperestrogenism    Palpitations    Pneumonia    PONV (postoperative nausea and vomiting)    Syncope and collapse    UTI (urinary tract infection)    Vertigo     Past Surgical History:  Procedure Laterality Date   ENDOMETRIAL ABLATION      Current Medications: No outpatient medications have been marked as taking for the 01/12/22 encounter (Office Visit) with Freada Bergeron, MD.     Allergies:   Latex   Social History   Socioeconomic  History   Marital status: Married    Spouse name: Not on file   Number of children: Not on file   Years of education: Not on file   Highest education level: Not on file  Occupational History   Not on file  Tobacco Use   Smoking status: Never   Smokeless tobacco: Never  Substance and Sexual Activity   Alcohol use: Never   Drug use: Never   Sexual activity: Not on file  Other Topics Concern   Not on file  Social History Narrative   Not on file   Social Determinants of Health   Financial Resource Strain: Not on file  Food Insecurity: Not on file  Transportation Needs: Not on file  Physical Activity: Not on file  Stress: Not on file  Social Connections: Not on file     Family History: Father with massive MI in his 47s.  ROS:   Please see the history of present illness.    Review of Systems  Constitutional:  Positive for malaise/fatigue. Negative for weight loss.  HENT:  Negative for hearing loss.   Eyes:  Negative for blurred vision and redness.  Respiratory:  Negative for shortness of breath.   Cardiovascular:  Negative for chest pain, palpitations, orthopnea, claudication, leg swelling and PND.  Gastrointestinal:  Negative for abdominal pain, blood in stool and melena.  Genitourinary:  Negative for dysuria and flank pain.  Musculoskeletal:  Positive for myalgias. Negative for falls.  Neurological:  Positive for dizziness and headaches.  Endo/Heme/Allergies:  Negative for polydipsia.  Psychiatric/Behavioral:  Negative for substance abuse. The patient has insomnia. The patient is not nervous/anxious.     EKGs/Labs/Other Studies Reviewed:    The following studies were reviewed today:  EKG: EKG is personally reviewed.  01/12/2022: Sinus rhythm. Rate 72 bpm.    CTA 07/24/20: FINDINGS: A 100 kV prospective scan was triggered in the descending thoracic aorta at 111 HU's. Axial non-contrast 3 mm slices were carried out through the heart. The data set was analyzed on  a dedicated work station and scored using the [place]. Gantry rotation speed was 250 msecs and collimation was .6 mm. No beta blockade and 0.8 mg of sl NTG was given. The 3D data set was reconstructed in 5% intervals of the 67-82 % of the R-R cycle. Diastolic phases were analyzed on a dedicated work station using MPR, MIP and VRT modes. The patient received 80 cc of contrast.   Aorta:  Normal size (24 mm).  No calcifications.  No dissection.   Aortic Valve:  Trileaflet.  No calcifications.   Coronary Arteries:  Normal coronary origin.  Right dominance.   RCA is a large dominant artery that gives rise to PDA and PLA. There is no plaque.   Left main is a large artery that gives rise to LAD, Intermedius Ramus and LCX arteries.   LAD is a  large vessel that has no plaque.   Intermedius Ramus is a small vessel with no plaques.   LCX is a non-dominant artery that gives rise to one large OM1 branch. There is no plaque.   Other findings:   Normal pulmonary vein drainage into the left atrium.   Normal left atrial appendage without a thrombus.   Normal size of the pulmonary artery.   IMPRESSION: 1. Coronary calcium score of 0. This was 0 percentile for age and sex matched control.   2. Normal coronary origin with right dominance.   3. No evidence of CAD. CAD-RADS 0. No evidence of CAD (0%). Consider non-atherosclerotic causes of chest pain.   Recent Labs: No results found for requested labs within last 365 days.   Recent Lipid Panel No results found for: "CHOL", "TRIG", "HDL", "CHOLHDL", "VLDL", "LDLCALC", "LDLDIRECT"   Risk Assessment/Calculations:       Physical Exam:    VS:  BP 112/68   Pulse 73   Ht 5\' 3"  (1.6 m)   Wt 97 lb 9.6 oz (44.3 kg)   SpO2 98%   BMI 17.29 kg/m     Wt Readings from Last 3 Encounters:  01/12/22 97 lb 9.6 oz (44.3 kg)  07/08/20 101 lb 12.8 oz (46.2 kg)  06/09/20 104 lb 3.2 oz (47.3 kg)     GEN:  Well nourished, well  developed in no acute distress HEENT: Normal NECK: No JVD; No carotid bruits CARDIAC: RRR, 1/6 systolic murmur RESPIRATORY:  Clear to auscultation without rales, wheezing or rhonchi  ABDOMEN: Soft, non-tender, non-distended MUSCULOSKELETAL:  No edema; No deformity  SKIN: Warm and dry NEUROLOGIC:  Alert and oriented x 3 PSYCHIATRIC:  Normal affect   ASSESSMENT:    1. Lightheadedness   2. Palpitations   3. Chest pain, unspecified type   4. Streptococcal infection    PLAN:    In order of problems listed above:  #Presyncope: Patient reports episodes of dizziness, generalized malaise with associated soft blood pressures that occur at night. Suspect this may be related to increased vagal tone after eating her largest meal of the day and prepping to go to sleep. She does not have associated SOB, chest pain, or significant palpitations. Will check zio to ensure no arrhythmogenic cause. She also states that her adrenal gland function was previously depressed and she wonders if this may be contributing. Discussed she could establish care with an Endocrinologist to work-up further. We can check a cortisol level today as well. -Check 7day zio -Check cortisol level -Increase hydration including electrolyte intake -Discussed it may help to have more frequent smaller meals rather than one larger meal at night    #Chest Pain: Patient with several year history of chest pain that is episodic in nature. Fortunately coronary CTA 07/2020 with no evidence of coronary disease and Ca score 0.  #Concern for systemic strep infection: Diagnosed by patient's Immunologist. Was placed on 6 month course PCN. Recommended following up with ID for further management.    Follow up in 6 months.  Medication Adjustments/Labs and Tests Ordered: Current medicines are reviewed at length with the patient today.  Concerns regarding medicines are outlined above.  Orders Placed This Encounter  Procedures   Cortisol    LONG TERM MONITOR (3-14 DAYS)   EKG 12-Lead   No orders of the defined types were placed in this encounter.   Patient Instructions  Medication Instructions:   Your physician recommends that you continue on your current medications as directed.  Please refer to the Current Medication list given to you today.  *If you need a refill on your cardiac medications before your next appointment, please call your pharmacy*   Lab Work:  TODAY--CORTISOL LEVEL  If you have labs (blood work) drawn today and your tests are completely normal, you will receive your results only by: [place] (if you have MyChart) OR A paper copy in the mail If you have any lab test that is abnormal or we need to change your treatment, we will call you to review the results.   Testing/Procedures:  Bryn Gulling- Long Term Monitor Instructions  Your physician has requested you wear a ZIO patch monitor for 7 days.  This is a single patch monitor. [employer] supplies one patch monitor per enrollment. Additional stickers are not available. Please do not apply patch if you will be having a Nuclear Stress Test,  Echocardiogram, Cardiac CT, MRI, or Chest Xray during the period you would be wearing the  monitor. The patch cannot be worn during these tests. You cannot remove and re-apply the  ZIO XT patch monitor.  Your ZIO patch monitor will be mailed 3 day USPS to your address on file. It may take 3-5 days  to receive your monitor after you have been enrolled.  Once you have received your monitor, please review the enclosed instructions. Your monitor  has already been registered assigning a specific monitor serial # to you.  Billing and Patient Assistance Program Information  We have supplied [employer] with any of your insurance information on file for billing purposes. [employer] offers a sliding scale Patient Assistance Program for patients that do not have  insurance, or whose insurance does not completely cover the cost  of the ZIO monitor.  You must apply for the Patient Assistance Program to qualify for this discounted rate.  To apply, please call [employer] at [phone], select option 4, select option 2, ask to apply for  Patient Assistance Program. Theodore Demark will ask your household income, and how many people  are in your household. They will quote your out-of-pocket cost based on that information.  [employer] will also be able to set up a 64-month, interest-free payment plan if needed.  Applying the monitor   Shave hair from upper left chest.  Hold abrader disc by orange tab. Rub abrader in 40 strokes over the upper left chest as  indicated in your monitor instructions.  Clean area with 4 enclosed alcohol pads. Let dry.  Apply patch as indicated in monitor instructions. Patch will be placed under collarbone on left  side of chest with arrow pointing upward.  Rub patch adhesive wings for 2 minutes. Remove white label marked "1". Remove the white  label marked "2". Rub patch adhesive wings for 2 additional minutes.  While looking in a mirror, press and release button in center of patch. A small green light will  flash 3-4 times. This will be your only indicator that the monitor has been turned on.  Do not shower for the first 24 hours. You may shower after the first 24 hours.  Press the button if you feel a symptom. You will hear a small click. Record Date, Time and  Symptom in the Patient Logbook.  When you are ready to remove the patch, follow instructions on the last 2 pages of Patient  Logbook. Stick patch monitor onto the last page of Patient Logbook.  Place Patient Logbook in the blue and white box. Use locking tab on box and  tape box closed  securely. The blue and white box has prepaid postage on it. Please place it in the mailbox as  soon as possible. Your physician should have your test results approximately 7 days after the  monitor has been mailed back to [facility].  Call [place] at [phone] if you have questions regarding  your ZIO XT patch monitor. Call them immediately if you see an orange light blinking on your  monitor.  If your monitor falls off in less than 4 days, contact our Monitor department at [phone].  If your monitor becomes loose or falls off after 4 days call [employer] at [phone] for  suggestions on securing your monitor    Follow-Up: At [facility], you and your health needs are our priority.  As part of our continuing mission to provide you with exceptional heart care, we have created designated Provider Care Teams.  These Care Teams include your primary Cardiologist (physician) and Advanced Practice Providers (APPs -  Physician Assistants and Nurse Practitioners) who all work together to provide you with the care you need, when you need it.  We recommend signing up for the patient portal called "MyChart".  Sign up information is provided on this After Visit Summary.  MyChart is used to connect with patients for Virtual Visits (Telemedicine).  Patients are able to view lab/test results, encounter notes, upcoming appointments, etc.  Non-urgent messages can be sent to your provider as well.   To learn more about what you can do with MyChart, go to [email or web address].    Your next appointment:   6 month(s)  The format for your next appointment:   In Person  Provider:   Freada Bergeron, MD              I,Rachel Rivera,acting as a scribe for Freada Bergeron, MD.,have documented all relevant documentation on the behalf of Freada Bergeron, MD,as directed by  Freada Bergeron, MD while in the presence of Freada Bergeron, MD.   I, Freada Bergeron, MD, have reviewed all documentation for this visit. The documentation on 01/12/22 for the exam, diagnosis, procedures, and orders are all accurate and complete.    Signed, Freada Bergeron, MD  01/12/2022 11:13 AM    [place]

## 2022-01-12 NOTE — Patient Instructions (Signed)
Medication Instructions:   Your physician recommends that you continue on your current medications as directed. Please refer to the Current Medication list given to you today.  *If you need a refill on your cardiac medications before your next appointment, please call your pharmacy*   Lab Work:  TODAY--CORTISOL LEVEL  If you have labs (blood work) drawn today and your tests are completely normal, you will receive your results only by: MyChart Message (if you have MyChart) OR A paper copy in the mail If you have any lab test that is abnormal or we need to change your treatment, we will call you to review the results.   Testing/Procedures:  Meagan Richards- Long Term Monitor Instructions  Your physician has requested you wear a ZIO patch monitor for 7 days.  This is a single patch monitor. [employer] supplies one patch monitor per enrollment. Additional stickers are not available. Please do not apply patch if you will be having a Nuclear Stress Test,  Echocardiogram, Cardiac CT, MRI, or Chest Xray during the period you would be wearing the  monitor. The patch cannot be worn during these tests. You cannot remove and re-apply the  ZIO XT patch monitor.  Your ZIO patch monitor will be mailed 3 day USPS to your address on file. It may take 3-5 days  to receive your monitor after you have been enrolled.  Once you have received your monitor, please review the enclosed instructions. Your monitor  has already been registered assigning a specific monitor serial # to you.  Billing and Patient Assistance Program Information  We have supplied [employer] with any of your insurance information on file for billing purposes. [employer] offers a sliding scale Patient Assistance Program for patients that do not have  insurance, or whose insurance does not completely cover the cost of the ZIO monitor.  You must apply for the Patient Assistance Program to qualify for this discounted rate.  To apply, please call  [employer] at [phone], select option 4, select option 2, ask to apply for  Patient Assistance Program. Meredeth Ide will ask your household income, and how many people  are in your household. They will quote your out-of-pocket cost based on that information.  [employer] will also be able to set up a 40-month, interest-free payment plan if needed.  Applying the monitor   Shave hair from upper left chest.  Hold abrader disc by orange tab. Rub abrader in 40 strokes over the upper left chest as  indicated in your monitor instructions.  Clean area with 4 enclosed alcohol pads. Let dry.  Apply patch as indicated in monitor instructions. Patch will be placed under collarbone on left  side of chest with arrow pointing upward.  Rub patch adhesive wings for 2 minutes. Remove white label marked "1". Remove the white  label marked "2". Rub patch adhesive wings for 2 additional minutes.  While looking in a mirror, press and release button in center of patch. A small green light will  flash 3-4 times. This will be your only indicator that the monitor has been turned on.  Do not shower for the first 24 hours. You may shower after the first 24 hours.  Press the button if you feel a symptom. You will hear a small click. Record Date, Time and  Symptom in the Patient Logbook.  When you are ready to remove the patch, follow instructions on the last 2 pages of Patient  Logbook. Stick patch monitor onto the last page of Patient Logbook.  Place Patient Logbook in the blue and white box. Use locking tab on box and tape box closed  securely. The blue and white box has prepaid postage on it. Please place it in the mailbox as  soon as possible. Your physician should have your test results approximately 7 days after the  monitor has been mailed back to [facility].  Call [place] at [phone] if you have questions regarding  your ZIO XT patch monitor. Call them immediately if you see an orange  light blinking on your  monitor.  If your monitor falls off in less than 4 days, contact our Monitor department at [phone].  If your monitor becomes loose or falls off after 4 days call [employer] at [phone] for  suggestions on securing your monitor    Follow-Up: At [facility], you and your health needs are our priority.  As part of our continuing mission to provide you with exceptional heart care, we have created designated Provider Care Teams.  These Care Teams include your primary Cardiologist (physician) and Advanced Practice Providers (APPs -  Physician Assistants and Nurse Practitioners) who all work together to provide you with the care you need, when you need it.  We recommend signing up for the patient portal called "MyChart".  Sign up information is provided on this After Visit Summary.  MyChart is used to connect with patients for Virtual Visits (Telemedicine).  Patients are able to view lab/test results, encounter notes, upcoming appointments, etc.  Non-urgent messages can be sent to your provider as well.   To learn more about what you can do with MyChart, go to [email or web address].    Your next appointment:   6 month(s)  The format for your next appointment:   In Person  Provider:   Freada Bergeron, MD

## 2022-01-13 ENCOUNTER — Telehealth: Payer: Self-pay | Admitting: Cardiology

## 2022-01-13 LAB — CORTISOL: Cortisol: 12.2 ug/dL (ref 6.2–19.4)

## 2022-01-13 NOTE — Telephone Encounter (Signed)
Nuala Alpha, LPN  11/15/7617  5:09 PM EDT Back to Top    The patient has been notified of the result and verbalized understanding.  All questions (if any) were answered. Nuala Alpha, LPN 32/09/7122 5:80 PM    [place], LPN  99/11/3380  5:05 PM EDT     Left message for the pt to call back for results.   Freada Bergeron, MD  01/13/2022 12:25 PM EDT     Her cortisol level was normal (hormone made by the adrenal gland). This is great news. Still probably a good idea to see a reproductive Endocrinologist given history.

## 2022-01-13 NOTE — Telephone Encounter (Signed)
Pt is returning call in regards to lab results. Transferred to [employer], Therapist, sports.

## 2022-01-18 DIAGNOSIS — R002 Palpitations: Secondary | ICD-10-CM

## 2022-01-21 ENCOUNTER — Telehealth: Payer: Self-pay | Admitting: Cardiology

## 2022-01-21 NOTE — Telephone Encounter (Signed)
Left another detailed message on the pts voicemail that I spoke with our monitor dept and they endorsed that she can choose to wear this for an additional week, but she will be charged an additional cost by the company at the same price they would charge for someone to wear the monitor for "14 days vs the ordered 7 day duration."     In the message left I endorsed that per [facility] in monitors the cost will be a couple dollars extra and will charge for 14 day duration and not 7 day, but unknown the exact amount the company will charge.  Left her a detailed message to call the office back with any additional questions or concerns regarding this.

## 2022-01-21 NOTE — Telephone Encounter (Signed)
Left a detailed message on the pts confirmed voicemail that the zio monitor is only ordered for the 7 days and she should wear the monitor during that course of time.  Message left that wearing the monitor past the time ordered may charge her an additional cost by the company and will more than likely not be covered by her insurance.  Left her a detailed message to call the office back with any additional questions or concerns.

## 2022-01-21 NOTE — Telephone Encounter (Signed)
Patient would like to know if she can wear the heart monitor for an additional week as she is on her menstrual cycle and her BP gets elevated and she would like to get the reading through the following week when her BP would return to normal levels.

## 2022-01-27 ENCOUNTER — Ambulatory Visit: Payer: BC Managed Care – PPO | Admitting: Cardiology

## 2022-07-19 NOTE — Progress Notes (Signed)
Cardiology Office Note:    Date:  07/28/2022   ID:  Meagan Richards, DOB 01-20-1991, MRN [id number]  PCP:  Paul Half, FNP   [facility]  Cardiologist:  Meriam Sprague, MD  Advanced Practice Provider:  No care team member to display Electrophysiologist:  None   Referring MD: Paul Half, *    History of Present Illness:    Meagan Richards is a 32 y.o. female with a hx of anxiety and endometriosis who presents to clinic for follow-up of chest pain.  Patient was seen in clinic on 05/2820 where she was having intermittent chest pain, jaw and arm pain over the past several years. Had several ER visits where work-up was reassuringly normal. Has also struggled with multiple health issues over the past several several years and has seen multiple providers which has been frustrating to her. Her father passed from a massive MI at age 88 and she states "no one listened to his symptoms and told him he was normal before he passed." She was very concerned that she is having symptoms similar to him.   At that visit, we recommended coronary CTA which was cancelled due to soft blood pressures. We then recommended using ivabradine instead. She ultimately underwent coronary CTA on 07/2020 which showed normal coronary arteries with Ca score 0.   Of note, she has also called clinic due to concern for systemic strep infection that was diagnosed by her Immunologist per report. She was placed on a 71month course of doxycycline. She had inquired about further testing. We recommended she establish care with Infectious Disease for further work-up and to ensure she merits long-term antibiotic therapy.  Was last seen in clinic on 01/2022 where she was having concerns for episodes of low blood pressure at night. Suspected she was having increased vagal tone from having a large meal in the evening and then going to bed. Zio monitor showed NSR, rare ectopy and no  arrhythmias. Cortisol levels were normal.  Today, the patient overall feels much better. Has not been having the vasovagal episodes at night. Was started on PCN shots for strep and she feels markedly improved. No chest pain, SOB, orthopnea or PND. Blood pressure is well controlled. Has mild swelling in her left ankle at the end of the day that is chronic but this improves with leg elevation and compression socks.   Past Medical History:  Diagnosis Date   Anxiety    Chest pain    Chronic headaches    Clavicle fracture    Constipation    Dizziness and giddiness    DOE (dyspnea on exertion)    Dysmenorrhea    Dyspnea    Endometriosis    Hyperestrogenism    Palpitations    Pneumonia    PONV (postoperative nausea and vomiting)    Syncope and collapse    UTI (urinary tract infection)    Vertigo     Past Surgical History:  Procedure Laterality Date   ENDOMETRIAL ABLATION      Current Medications: Current Meds  Medication Sig   penicillin potassium (VEETID) 125 MG/5ML solution Take 125 mg by mouth. Patient take it monthly.   Progesterone Micronized (PROGESTERONE, BULK,) POWD 25 mg at bedtime.     Allergies:   Latex   Social History   Socioeconomic History   Marital status: Married    Spouse name: Not on file   Number of children: Not on file   Years of education: Not  on file   Highest education level: Not on file  Occupational History   Not on file  Tobacco Use   Smoking status: Never   Smokeless tobacco: Never  Substance and Sexual Activity   Alcohol use: Never   Drug use: Never   Sexual activity: Not on file  Other Topics Concern   Not on file  Social History Narrative   Not on file   Social Determinants of Health   Financial Resource Strain: Not on file  Food Insecurity: Not on file  Transportation Needs: Not on file  Physical Activity: Not on file  Stress: Not on file  Social Connections: Not on file     Family History: Father with massive MI in  his 23s.  ROS:   Please see the history of present illness.    Review of Systems  Constitutional:  Negative for weight loss.  HENT:  Negative for hearing loss.   Eyes:  Negative for blurred vision and redness.  Respiratory:  Negative for shortness of breath.   Cardiovascular:  Negative for chest pain, palpitations, orthopnea, claudication, leg swelling and PND.  Gastrointestinal:  Negative for abdominal pain, blood in stool and melena.  Genitourinary:  Negative for dysuria and flank pain.  Musculoskeletal:  Positive for myalgias. Negative for falls.  Neurological:  Negative for dizziness.  Endo/Heme/Allergies:  Negative for polydipsia.  Psychiatric/Behavioral:  Negative for substance abuse. The patient has insomnia. The patient is not nervous/anxious.     EKGs/Labs/Other Studies Reviewed:    The following studies were reviewed today:  EKG: EKG is personally reviewed.  01/12/2022: Sinus rhythm. Rate 72 bpm.   Cardiac Monitor 01/2022:   Patch wear time was 5 days and 13 hours   Predominant rhythm was NSR with average HR 76bpm (ranged from 50bpm-146bpm)   Rare SVE (<1%), rare VE (<1%)   Patient triggered events correlated with NSR   No sustained arrhythmias or significant pauses   Overall, normal cardiac monitor     Patch Wear Time:  5 days and 13 hours (2023-10-09T21:35:33-0400 to 2023-10-15T11:16:48-0400)   Patient had a min HR of 50 bpm, max HR of 146 bpm, and avg HR of 76 bpm. Predominant underlying rhythm was Sinus Rhythm. Isolated SVEs were rare (<1.0%), SVE Couplets were rare (<1.0%), and no SVE Triplets were present. Isolated VEs were rare (<1.0%),  and no VE Couplets or VE Triplets were present.    Laurance Flatten, MD   CTA 07/24/20: FINDINGS: A 100 kV prospective scan was triggered in the descending thoracic aorta at 111 HU's. Axial non-contrast 3 mm slices were carried out through the heart. The data set was analyzed on a dedicated work station and scored using  the Agatson method. Gantry rotation speed was 250 msecs and collimation was .6 mm. No beta blockade and 0.8 mg of sl NTG was given. The 3D data set was reconstructed in 5% intervals of the 67-82 % of the R-R cycle. Diastolic phases were analyzed on a dedicated work station using MPR, MIP and VRT modes. The patient received 80 cc of contrast.   Aorta:  Normal size (24 mm).  No calcifications.  No dissection.   Aortic Valve:  Trileaflet.  No calcifications.   Coronary Arteries:  Normal coronary origin.  Right dominance.   RCA is a large dominant artery that gives rise to PDA and PLA. There is no plaque.   Left main is a large artery that gives rise to LAD, Intermedius Ramus and LCX arteries.  LAD is a large vessel that has no plaque.   Intermedius Ramus is a small vessel with no plaques.   LCX is a non-dominant artery that gives rise to one large OM1 branch. There is no plaque.   Other findings:   Normal pulmonary vein drainage into the left atrium.   Normal left atrial appendage without a thrombus.   Normal size of the pulmonary artery.   IMPRESSION: 1. Coronary calcium score of 0. This was 0 percentile for age and sex matched control.   2. Normal coronary origin with right dominance.   3. No evidence of CAD. CAD-RADS 0. No evidence of CAD (0%). Consider non-atherosclerotic causes of chest pain.   Recent Labs: No results found for requested labs within last 365 days.   Recent Lipid Panel No results found for: "CHOL", "TRIG", "HDL", "CHOLHDL", "VLDL", "LDLCALC", "LDLDIRECT"   Risk Assessment/Calculations:       Physical Exam:    VS:  BP 102/75   Pulse 86   Ht 5\' 3"  (1.6 m)   Wt 97 lb (44 kg)   SpO2 97%   BMI 17.18 kg/m     Wt Readings from Last 3 Encounters:  07/28/22 97 lb (44 kg)  01/12/22 97 lb 9.6 oz (44.3 kg)  07/08/20 101 lb 12.8 oz (46.2 kg)     GEN:  Well nourished, well developed in no acute distress HEENT: Normal NECK: No JVD; No  carotid bruits CARDIAC: RRR, 1/6 systolic murmur RESPIRATORY:  Clear to auscultation without rales, wheezing or rhonchi  ABDOMEN: Soft, non-tender, non-distended MUSCULOSKELETAL:  No edema; No deformity  SKIN: Warm and dry NEUROLOGIC:  Alert and oriented x 3 PSYCHIATRIC:  Normal affect   ASSESSMENT:    1. Lightheadedness   2. Palpitations   3. Chest pain, unspecified type    PLAN:    In order of problems listed above:  #Presyncope: -Thought to be vagal in nature; overall has significantly improved with conservative measures -7 day zio with NSR with no arrhythmias or pauses -Cortisol levels normal -Increase hydration including electrolyte intake -Discussed it may help to have more frequent smaller meals rather than one larger meal at night    #Chest Pain: Patient with several year history of chest pain that is episodic in nature. Fortunately coronary CTA 07/2020 with no evidence of coronary disease and Ca score 0.  #Concern for systemic strep infection: Diagnosed by patient's Immunologist. Now on PCN injections and feeling much better.   Medication Adjustments/Labs and Tests Ordered: Current medicines are reviewed at length with the patient today.  Concerns regarding medicines are outlined above.  No orders of the defined types were placed in this encounter.  No orders of the defined types were placed in this encounter.   Patient Instructions  Medication Instructions:   Your physician recommends that you continue on your current medications as directed. Please refer to the Current Medication list given to you today.  *If you need a refill on your cardiac medications before your next appointment, please call your pharmacy*    Follow-Up: At [facility], you and your health needs are our priority.  As part of our continuing mission to provide you with exceptional heart care, we have created designated Provider Care Teams.  These Care Teams include your primary  Cardiologist (physician) and Advanced Practice Providers (APPs -  Physician Assistants and Nurse Practitioners) who all work together to provide you with the care you need, when you need it.  We recommend signing  up for the patient portal called "MyChart".  Sign up information is provided on this After Visit Summary.  MyChart is used to connect with patients for Virtual Visits (Telemedicine).  Patients are able to view lab/test results, encounter notes, upcoming appointments, etc.  Non-urgent messages can be sent to your provider as well.   To learn more about what you can do with MyChart, go to [email or web address].    Your next appointment:   1 year(s)  Provider:   Meriam Sprague, MD          Signed, Meriam Sprague, MD  07/28/2022 2:32 PM    [facility]

## 2022-07-28 ENCOUNTER — Ambulatory Visit: Payer: 59 | Attending: Cardiology | Admitting: Cardiology

## 2022-07-28 ENCOUNTER — Encounter: Payer: Self-pay | Admitting: Cardiology

## 2022-07-28 VITALS — BP 102/75 | HR 86 | Ht 63.0 in | Wt 97.0 lb

## 2022-07-28 DIAGNOSIS — R002 Palpitations: Secondary | ICD-10-CM | POA: Diagnosis not present

## 2022-07-28 DIAGNOSIS — R079 Chest pain, unspecified: Secondary | ICD-10-CM

## 2022-07-28 DIAGNOSIS — R42 Dizziness and giddiness: Secondary | ICD-10-CM | POA: Diagnosis not present

## 2022-07-28 NOTE — Patient Instructions (Signed)
Medication Instructions:   Your physician recommends that you continue on your current medications as directed. Please refer to the Current Medication list given to you today.  *If you need a refill on your cardiac medications before your next appointment, please call your pharmacy*    Follow-Up: At [facility], you and your health needs are our priority.  As part of our continuing mission to provide you with exceptional heart care, we have created designated Provider Care Teams.  These Care Teams include your primary Cardiologist (physician) and Advanced Practice Providers (APPs -  Physician Assistants and Nurse Practitioners) who all work together to provide you with the care you need, when you need it.  We recommend signing up for the patient portal called "MyChart".  Sign up information is provided on this After Visit Summary.  MyChart is used to connect with patients for Virtual Visits (Telemedicine).  Patients are able to view lab/test results, encounter notes, upcoming appointments, etc.  Non-urgent messages can be sent to your provider as well.   To learn more about what you can do with MyChart, go to [email or web address].    Your next appointment:   1 year(s)  Provider:   Heather E Pemberton, MD
